# Patient Record
Sex: Female | Born: 1940
Health system: Southern US, Community
[De-identification: ages and names within clinical notes are randomized; demographics above are authoritative.]

## PROBLEM LIST (undated history)

## (undated) DIAGNOSIS — I10 Essential (primary) hypertension: Secondary | ICD-10-CM

## (undated) DIAGNOSIS — R7303 Prediabetes: Secondary | ICD-10-CM

## (undated) DIAGNOSIS — N189 Chronic kidney disease, unspecified: Secondary | ICD-10-CM

## (undated) DIAGNOSIS — H409 Unspecified glaucoma: Secondary | ICD-10-CM

## (undated) DIAGNOSIS — E785 Hyperlipidemia, unspecified: Secondary | ICD-10-CM

## (undated) HISTORY — PX: TUBAL LIGATION: SHX77

## (undated) HISTORY — DX: Prediabetes: R73.03

## (undated) HISTORY — DX: Hyperlipidemia, unspecified: E78.5

## (undated) HISTORY — DX: Unspecified glaucoma: H40.9

## (undated) HISTORY — DX: Essential (primary) hypertension: I10

## (undated) HISTORY — DX: Chronic kidney disease, unspecified: N18.9

---

## 2007-10-30 ENCOUNTER — Emergency Department (HOSPITAL_BASED_OUTPATIENT_CLINIC_OR_DEPARTMENT_OTHER): Admission: EM | Admit: 2007-10-30 | Discharge: 2007-10-31 | Payer: Self-pay | Admitting: Emergency Medicine

## 2007-11-07 ENCOUNTER — Ambulatory Visit: Payer: Self-pay | Admitting: Family Medicine

## 2007-11-07 DIAGNOSIS — H9190 Unspecified hearing loss, unspecified ear: Secondary | ICD-10-CM | POA: Insufficient documentation

## 2007-11-07 DIAGNOSIS — I10 Essential (primary) hypertension: Secondary | ICD-10-CM | POA: Insufficient documentation

## 2007-11-14 ENCOUNTER — Telehealth: Payer: Self-pay | Admitting: Family Medicine

## 2007-11-16 ENCOUNTER — Encounter: Payer: Self-pay | Admitting: Family Medicine

## 2007-11-16 LAB — CONVERTED CEMR LAB
ALT: 12 units/L (ref 0–35)
AST: 17 units/L (ref 0–37)
Albumin: 3.6 g/dL (ref 3.5–5.2)
Alkaline Phosphatase: 86 units/L (ref 39–117)
Calcium: 9 mg/dL (ref 8.4–10.5)
Chloride: 106 meq/L (ref 96–112)
Potassium: 4.8 meq/L (ref 3.5–5.3)
Total CHOL/HDL Ratio: 8.3

## 2007-11-17 ENCOUNTER — Telehealth (INDEPENDENT_AMBULATORY_CARE_PROVIDER_SITE_OTHER): Payer: Self-pay | Admitting: *Deleted

## 2007-11-17 ENCOUNTER — Encounter: Payer: Self-pay | Admitting: Family Medicine

## 2007-11-18 LAB — CONVERTED CEMR LAB: Direct LDL: 127 mg/dL — ABNORMAL HIGH

## 2008-09-22 ENCOUNTER — Encounter: Payer: Self-pay | Admitting: Family Medicine

## 2009-03-19 ENCOUNTER — Other Ambulatory Visit: Admission: RE | Admit: 2009-03-19 | Discharge: 2009-03-19 | Payer: Self-pay | Admitting: Family Medicine

## 2009-03-19 ENCOUNTER — Ambulatory Visit: Payer: Self-pay | Admitting: Family Medicine

## 2009-03-19 ENCOUNTER — Encounter: Payer: Self-pay | Admitting: Family Medicine

## 2009-03-19 DIAGNOSIS — K219 Gastro-esophageal reflux disease without esophagitis: Secondary | ICD-10-CM | POA: Insufficient documentation

## 2009-03-19 DIAGNOSIS — R300 Dysuria: Secondary | ICD-10-CM | POA: Insufficient documentation

## 2009-03-19 LAB — CONVERTED CEMR LAB
Bilirubin Urine: NEGATIVE
Glucose, Urine, Semiquant: NEGATIVE
Nitrite: NEGATIVE
Urobilinogen, UA: 0.2
WBC Urine, dipstick: NEGATIVE
pH: 5.5

## 2009-03-20 ENCOUNTER — Encounter: Payer: Self-pay | Admitting: Family Medicine

## 2009-03-20 LAB — CONVERTED CEMR LAB: RBC / HPF: NONE SEEN

## 2009-03-21 ENCOUNTER — Telehealth: Payer: Self-pay | Admitting: Family Medicine

## 2009-03-21 DIAGNOSIS — R87619 Unspecified abnormal cytological findings in specimens from cervix uteri: Secondary | ICD-10-CM | POA: Insufficient documentation

## 2009-04-03 ENCOUNTER — Ambulatory Visit: Payer: Self-pay | Admitting: Obstetrics & Gynecology

## 2009-08-09 ENCOUNTER — Ambulatory Visit: Payer: Self-pay | Admitting: Family Medicine

## 2009-08-09 DIAGNOSIS — R413 Other amnesia: Secondary | ICD-10-CM

## 2009-08-09 DIAGNOSIS — F0280 Dementia in other diseases classified elsewhere without behavioral disturbance: Secondary | ICD-10-CM | POA: Insufficient documentation

## 2009-08-13 ENCOUNTER — Telehealth: Payer: Self-pay | Admitting: Family Medicine

## 2009-08-21 ENCOUNTER — Encounter: Payer: Self-pay | Admitting: Family Medicine

## 2009-08-21 LAB — CONVERTED CEMR LAB
ALT: 12 units/L (ref 0–35)
Alkaline Phosphatase: 84 units/L (ref 39–117)
Chloride: 100 meq/L (ref 96–112)
Glucose, Bld: 119 mg/dL — ABNORMAL HIGH (ref 70–99)
LDL Cholesterol: 151 mg/dL — ABNORMAL HIGH (ref 0–99)
Potassium: 4.4 meq/L (ref 3.5–5.3)
TSH: 2.044 microintl units/mL (ref 0.350–4.500)
Total Bilirubin: 0.7 mg/dL (ref 0.3–1.2)
Total Protein: 7.3 g/dL (ref 6.0–8.3)
VLDL: 47 mg/dL — ABNORMAL HIGH (ref 0–40)

## 2009-08-26 ENCOUNTER — Encounter: Payer: Self-pay | Admitting: Family Medicine

## 2010-04-17 ENCOUNTER — Encounter (INDEPENDENT_AMBULATORY_CARE_PROVIDER_SITE_OTHER): Payer: Self-pay | Admitting: *Deleted

## 2010-06-10 NOTE — Miscellaneous (Signed)
Summary: Immunization Entry   Immunization History:  Influenza Immunization History:    Influenza:  historical (04/17/2010)

## 2010-06-10 NOTE — Assessment & Plan Note (Signed)
Summary: FU HTN, memory   Vital Signs:  Patient profile:   70 year old female Height:      64.5 inches Weight:      133 pounds Pulse rate:   89 / minute BP sitting:   170 / 88  (left arm) Cuff size:   regular  Vitals Entered By: Geanie Kenning (August 09, 2009 11:09 AM) CC: follow-up Bp, Hypertension Management Comments Re check Bp @ 11:32am- BP-165/88  P=76   Primary Care Provider:  Beatrice Lecher MD  CC:  follow-up Bp and Hypertension Management.  History of Present Illness: Had a mildHA last week.  Doesn't check BP at home. Haven't had labs in over a year. Says has taken her pill every day. No SE and doing well on it.  Says was on HCTZ years ago but doesn't remember having any SE from it.     Also would like her memory tested. Per her daughter who was here wiht her last time the family is concnered about her memory. Has been more forgetful. Pt says she does a lot of crossword puzzles to keep her brain actie.    Hypertension History:      She complains of headache, but denies chest pain, palpitations, dyspnea with exertion, orthopnea, PND, peripheral edema, visual symptoms, neurologic problems, syncope, and side effects from treatment.        Positive major cardiovascular risk factors include female age 86 years old or older, hypertension, and current tobacco user.  Negative major cardiovascular risk factors include negative family history for ischemic heart disease.     Current Medications (verified): 1)  Lisinopril 40 Mg Tabs (Lisinopril) .... Take 1 Tablet By Mouth Once A Day  Comments:  Nurse/Medical Assistant: The patient's medications and allergies were reviewed with the patient and were updated in the Medication and Allergy Lists. Geanie Kenning (August 09, 2009 11:09 AM)  Past History:  Past Medical History: HTN MMSE 27/30.    Social History: Reviewed history from 03/19/2009 and no changes required. Current Smoker. Has a boyfriend.  Alcohol use-no Drug  use-no Regular exercise-yes  Physical Exam  General:  Well-developed,well-nourished,in no acute distress; alert,appropriate and cooperative throughout examination Head:  Normocephalic and atraumatic without obvious abnormalities. No apparent alopecia or balding. Eyes:  No corneal or conjunctival inflammation noted. EOMI. Perrla.  Lungs:  Normal respiratory effort, chest expands symmetrically. Lungs are clear to auscultation, no crackles or wheezes. Heart:  Normal rate and regular rhythm. S1 and S2 normal without gallop, murmur, click, rub or other extra sounds. Skin:  no rashes.   Psych:  Cognition and judgment appear intact. Alert and cooperative with normal attention span and concentration. No apparent delusions, illusions, hallucinations   Impression & Recommendations:  Problem # 1:  HYPERTENSION, BENIGN (ICD-401.1) Assessment Comment Only Still elevated. Says has been very consistant with her pill. Will change to losartan with hctz.  F/u in  month to recheck. Call sooner if any SE.  Due for CMP. Will recheck in one week once starts the new BP medication. Also due for screening lipids.  Her updated medication list for this problem includes:    Losartan Potassium-hctz 100-12.5 Mg Tabs (Losartan potassium-hctz) .Marland Kitchen... Take 1 tablet by mouth once a day  Orders: T-Comprehensive Metabolic Panel ([id number]) T-Lipid Profile [id number]) T-TSH [id number])  Problem # 2:  MEMORY LOSS (ICD-780.93) Assessment: New MMSE exam today scored 27/30 (passing for her age and education is 57). Discussed that this stilll may be normal but may have  some premature memory loss. Discused repeating testin 6-12 months and getting BP under good control as not to cause any ischemic changes to her brain.  Also work on work puzzles and reading to keep her brain active and may want to use a calendar and reminder system to he;lp her stay organized.  Will also check her thyroid levels.    Complete Medication  List: 1)  Losartan Potassium-hctz 100-12.5 Mg Tabs (Losartan potassium-hctz) .... Take 1 tablet by mouth once a day  Hypertension Assessment/Plan:      The patient's hypertensive risk group is category B: At least one risk factor (excluding diabetes) with no target organ damage.  Today's blood pressure is 170/88.    Patient Instructions: 1)  Go to the lab in about one week.  2)  Follow up in one month with med for blood pressure check.  Prescriptions: LOSARTAN POTASSIUM-HCTZ 100-12.5 MG TABS (LOSARTAN POTASSIUM-HCTZ) Take 1 tablet by mouth once a day  #30 x 1   Entered and Authorized by:   Beatrice Lecher MD   Signed by:   Beatrice Lecher MD on 08/09/2009   Method used:   Electronically to        [facility] [phone]* (retail)       [address] [place], [place]  [address]       Ph: [phone]       Fax: [phone]   [place]:   [phone]

## 2010-06-10 NOTE — Progress Notes (Signed)
Summary: Med for H/A  Phone Note Call from Patient Call back at [phone]   Caller: Patient Call For: Beatrice Lecher MD Summary of Call: Pt calls and wants to know what she could take for a headache Initial call taken by: Geanie Kenning,  August 13, 2009 4:32 PM  Follow-up for Phone Call        Tylenol is safe since has High blood pressure.  Follow-up by: Beatrice Lecher MD,  August 13, 2009 4:52 PM  Additional Follow-up for Phone Call Additional follow up Details #1::        pt notified of MD instructions. Additional Follow-up by: Geanie Kenning,  August 13, 2009 4:55 PM

## 2010-06-10 NOTE — Letter (Signed)
Summary: MMSE Form/[facility]  MMSE Form/[facility]   Imported By: Edmonia James 08/19/2009 10:16:45  _____________________________________________________________________  External Attachment:    Type:   Image     Comment:   External Document

## 2010-06-10 NOTE — Letter (Signed)
Summary: Generic Letter  [facility]  [address] [place]   [place], [place] [address]   Phone: [phone]  Fax: [phone]    08/26/2009  Cassidy Jennings [address] [place], [place]  [address]  Dear [name],  We have received your lab work back and have been unable to reach you. Your blood sugar is elevated so lets recheck it when you come in for your appointment in May. In meantime avoid concentrated sugars and decrease your carbohydrate intake. Cholesterol is high and really need to start a cholesterol pill at bedtime in addition to exercise and watching a low fat diet.  You will also need a pill to help get your cholesterol completely down. We have sent a prescription for this med to your pharmacy.  Lets recheck your levels in 6-8 weeks.  Your thyroid level was normal.    Attached is a copy of your labwork.         Sincerely,   Beatrice Lecher, MD

## 2010-10-29 ENCOUNTER — Other Ambulatory Visit: Payer: Self-pay | Admitting: Family Medicine

## 2010-10-30 ENCOUNTER — Telehealth: Payer: Self-pay | Admitting: Family Medicine

## 2010-10-30 NOTE — Telephone Encounter (Signed)
Needs f/u appt 

## 2010-10-30 NOTE — Telephone Encounter (Signed)
Call pt: She needs f/u appt has been over a year.

## 2010-10-31 NOTE — Telephone Encounter (Addendum)
Tried to reach the pt.  [employer] [employer] out of service area and couldn't take a message.  Will wait for the pt to call the office back. Morene Rankins, LPN [facility]

## 2011-02-05 LAB — COMPREHENSIVE METABOLIC PANEL
ALT: 15
AST: 22
CO2: 33 — ABNORMAL HIGH
Calcium: 9.5
Chloride: 94 — ABNORMAL LOW
GFR calc Af Amer: 46 — ABNORMAL LOW
GFR calc non Af Amer: 38 — ABNORMAL LOW
Sodium: 137
Total Bilirubin: 0.6

## 2011-02-05 LAB — CBC
RBC: 4.43
WBC: 6.9

## 2011-02-05 LAB — POCT CARDIAC MARKERS: CKMB, poc: 2.2

## 2011-02-05 LAB — DIFFERENTIAL
Eosinophils Absolute: 0
Eosinophils Relative: 1
Lymphs Abs: 1.9
Monocytes Absolute: 0.4

## 2011-02-05 LAB — PROTIME-INR: Prothrombin Time: 12.9

## 2011-04-01 ENCOUNTER — Other Ambulatory Visit: Payer: Self-pay | Admitting: *Deleted

## 2011-04-01 ENCOUNTER — Other Ambulatory Visit: Payer: Self-pay | Admitting: Family Medicine

## 2011-04-01 MED ORDER — SIMVASTATIN 40 MG PO TABS: 40.0000 mg | ORAL_TABLET | Freq: Every day | ORAL | Status: DC

## 2011-04-06 ENCOUNTER — Other Ambulatory Visit: Payer: Self-pay | Admitting: *Deleted

## 2011-04-06 MED ORDER — LOSARTAN POTASSIUM-HCTZ 50-12.5 MG PO TABS: 1.0000 | ORAL_TABLET | Freq: Every day | ORAL | Status: DC

## 2011-04-14 ENCOUNTER — Telehealth: Payer: Self-pay | Admitting: *Deleted

## 2011-04-14 NOTE — Telephone Encounter (Signed)
What are her allergies? I can call in a new ABX for her.

## 2011-04-14 NOTE — Telephone Encounter (Signed)
Pt seen in ED last Tuesday and diagnosed with bronchitis and given Prednisone, albuterol inhaler and antiobiotic Clarithomycin 500mg  2 tabs by mouth. Has finished all meds and still coughing with some tellow phlegm, afebrile, sweats. Doesn't feel any better at all. PLease advise

## 2011-04-15 MED ORDER — AZITHROMYCIN 250 MG PO TABS: ORAL_TABLET | ORAL | Status: AC

## 2011-04-15 NOTE — Telephone Encounter (Signed)
Pt has no allergies.

## 2011-04-15 NOTE — Telephone Encounter (Signed)
Rx sent 

## 2011-04-16 ENCOUNTER — Ambulatory Visit (INDEPENDENT_AMBULATORY_CARE_PROVIDER_SITE_OTHER): Payer: Medicare Other | Admitting: Family Medicine

## 2011-04-16 VITALS — BP 145/82 | HR 85 | Ht 63.0 in | Wt 138.0 lb

## 2011-04-16 DIAGNOSIS — Z2839 Other underimmunization status: Secondary | ICD-10-CM

## 2011-04-16 DIAGNOSIS — H6120 Impacted cerumen, unspecified ear: Secondary | ICD-10-CM | POA: Insufficient documentation

## 2011-04-16 DIAGNOSIS — Z283 Underimmunization status: Secondary | ICD-10-CM

## 2011-04-16 MED ORDER — CARBAMIDE PEROXIDE 6.5 % OT SOLN: 5.0000 [drp] | Freq: Two times a day (BID) | OTIC | Status: AC

## 2011-04-16 NOTE — Assessment & Plan Note (Addendum)
Subjective:    Cassidy Jennings is a 70 y.o. female whom I am asked to see for evaluation of diminished hearing in the left ear for the past ongoing months. There is a prior history of cerumen impaction. The patient has not been using ear drops to loosen wax immediately prior to this visit. The patient denies ear pain.  The patient's history has been marked as reviewed and updated as appropriate.  Review of Systems Pertinent items are noted in HPI.    Objective:    Auditory canal(s) of the left ear are completely obstructed with cerumen.   Cerumen removal after using gentle irrigation and soft plastic curettes after irrigation. Was unsuccesssful in removing sany significant wasx Assessment:    Cerumen Impaction without otitis externa.   unsuccessfull in resolving ear impaction since a significant amount is still present Plan:    1. Care instructions given. 2. Home treatment: debrox ear wx softner. 3. Follow-up in 5 days for repeat attempt

## 2011-04-16 NOTE — Patient Instructions (Signed)
Cerumen Impaction A cerumen impaction is when the wax in your ear forms a plug. This plug usually causes reduced hearing. Sometimes it also causes an earache or dizziness. Removing a cerumen impaction can be difficult and painful. The wax sticks to the ear canal. The canal is sensitive and bleeds easily. If you try to remove a heavy wax buildup with a cotton tipped swab, you may push it in further. Irrigation with water, suction, and small ear curettes may be used to clear out the wax. If the impaction is fixed to the skin in the ear canal, ear drops may be needed for a few days to loosen the wax. People who build up a lot of wax frequently can use ear wax removal products available in your local drugstore. SEEK MEDICAL CARE IF:  You develop an earache, increased hearing loss, or marked dizziness. Document Released: 06/04/2004 Document Revised: 01/07/2011 Document Reviewed: 07/25/2009 [facility] Patient Information 2012 [place].

## 2011-04-19 ENCOUNTER — Encounter: Payer: Self-pay | Admitting: Family Medicine

## 2011-04-19 NOTE — Progress Notes (Signed)
  Subjective:    Patient ID: Cassidy Jennings, female    DOB: 18-Dec-1940, 70 y.o.   MRN: [id number]  HPI #1 immunization update Excessive wax in L ear. She reports pain in the L ear due to wax build up.  Review of Systems  Respiratory:       Hx of recent bronchitis a few weeks ago      BP 145/82  Pulse 85  Ht 5\' 3"  (1.6 m)  Wt 138 lb (62.596 kg)  BMI 24.45 kg/m2  SpO2 96% Objective:   Physical Exam  Constitutional: She appears well-developed.  HENT:  Head: Normocephalic.  Right Ear: Decreased hearing is noted.  Left Ear: No decreased hearing is noted.       Wax present in both ears but L>R          Assessment & Plan:  #1 offered to try and update about multiple immunization and health needs patient literaly acted as if sher did not hear me talking about them

## 2011-04-21 ENCOUNTER — Ambulatory Visit (INDEPENDENT_AMBULATORY_CARE_PROVIDER_SITE_OTHER): Payer: Medicare Other | Admitting: Family Medicine

## 2011-04-21 VITALS — BP 128/68 | HR 100 | Ht 64.0 in | Wt 138.0 lb

## 2011-04-21 DIAGNOSIS — Z23 Encounter for immunization: Secondary | ICD-10-CM

## 2011-04-21 DIAGNOSIS — H6120 Impacted cerumen, unspecified ear: Secondary | ICD-10-CM

## 2011-04-21 MED ORDER — TETANUS-DIPHTH-ACELL PERTUSSIS 5-2.5-18.5 LF-MCG/0.5 IM SUSP: 0.5000 mL | Freq: Once | INTRAMUSCULAR | Status: DC

## 2011-04-21 NOTE — Patient Instructions (Signed)

## 2011-04-21 NOTE — Progress Notes (Signed)
  Subjective:    Patient ID: Cassidy Jennings, female    DOB: 11-22-1940, 70 y.o.   MRN: [id number]  HPI Patient's here for recheck of her ear . Excessive wax was present in both years with the left being markedly impacted.  #2 over 2 health maintenance Review of Systems  All other systems reviewed and are negative.      BP 128/68  Pulse 100  Ht 5\' 4"  (1.626 m)  Wt 138 lb (62.596 kg)  BMI 23.69 kg/m2  SpO2 97% Objective:   Physical Exam  After irrigation both ears are markedly improved which is a little bit of redness of the left eardrum present.      Assessment & Plan:  Assessment plan excessive wax in the ears resolved. Recommend using the Debrox on a regular basis at least 2-3 times a week and increasing the amount when wax buildup occurs.  #2 discussed patient she's agreed to get her tetanus and her pneumonia shot today we'll have her come back for complete physical in 3-4 months at that time we did discuss with her about colonoscopy and also shingles vaccination.

## 2011-06-23 ENCOUNTER — Encounter: Payer: Self-pay | Admitting: Physician Assistant

## 2011-06-23 ENCOUNTER — Ambulatory Visit (INDEPENDENT_AMBULATORY_CARE_PROVIDER_SITE_OTHER): Payer: Medicare Other | Admitting: Physician Assistant

## 2011-06-23 VITALS — BP 141/77 | HR 101 | Temp 98.1°F | Wt 138.0 lb

## 2011-06-23 DIAGNOSIS — L299 Pruritus, unspecified: Secondary | ICD-10-CM

## 2011-06-23 DIAGNOSIS — T50995A Adverse effect of other drugs, medicaments and biological substances, initial encounter: Secondary | ICD-10-CM

## 2011-06-23 DIAGNOSIS — Z888 Allergy status to other drugs, medicaments and biological substances status: Secondary | ICD-10-CM

## 2011-06-23 MED ORDER — HYDROXYZINE HCL 25 MG PO TABS: 25.0000 mg | ORAL_TABLET | Freq: Three times a day (TID) | ORAL | Status: AC | PRN

## 2011-06-23 MED ORDER — METHYLPREDNISOLONE ACETATE 80 MG/ML IJ SUSP: 80.0000 mg | Freq: Once | INTRAMUSCULAR | Status: DC

## 2011-06-23 NOTE — Progress Notes (Signed)
  Subjective:    Patient ID: Cassidy Jennings, female    DOB: [birth year], 71 y.o.   MRN: [id number]  HPI Patient presents to the clinic with itching for 3 days. She went to get her hair dyed 3 days ago and she had an allergic reaction. Her face got red and swollen and she started itching all over her body. Patient went to the emergency room on Sunday night(2 days ago). They gave her IV Benadryl and prednisone. She was also given oral Benadryl and prednisone to take home and take. Patient was scared to take prednisone and did not take any orally. She has taken the Benadryl on a daily basis and it does help her sleep some and help the itching minimally. She still continues to itch all over her body. She denies any shortness of breath, wheezing, or problems swallowing. Has appointment with allergist tomorrow.    Review of Systems     Objective:   Physical Exam  Constitutional: She is oriented to person, place, and time. She appears well-developed and well-nourished.       Upon entering room patient was distressed scratching all over her body from her head down to her toes. She did this the entire interview.   HENT:  Head: Normocephalic and atraumatic.  Cardiovascular:       Tachycardia at 101. Systolic ejection murmur 3/6. Normal rhythm.  Pulmonary/Chest: Effort normal and breath sounds normal. She has no wheezes.  Neurological: She is alert and oriented to person, place, and time.  Skin:       Small purpuric papules all over arms, legs, trunk, and head. Excoriations present all over body. No edema.          Assessment & Plan:  Itching/allergic reaction to dye- Vistaril 25 mg up to TID for itching. Depo-Medrol 80 mg IM given in office. Can continue to take Benadryl at night to help her sleep. Keep appointment with allergist for tomorrow. Follow up with any worsening of symptoms. Go to emergency room if you develop problems swallowing or trouble breathing.  Discussed colonoscopy. Patient  is aware of the importance of this screening test and wants to be reminded later this year.

## 2011-06-23 NOTE — Patient Instructions (Signed)
Gave Prednisone shot in the office. Vistaril to take up TID for itching. Continue to take Benadryl at night. Keep appointment with allergist.

## 2011-07-14 ENCOUNTER — Encounter: Payer: Medicare Other | Admitting: Family Medicine

## 2011-07-14 DIAGNOSIS — Z0289 Encounter for other administrative examinations: Secondary | ICD-10-CM

## 2011-07-22 ENCOUNTER — Other Ambulatory Visit: Payer: Self-pay | Admitting: Family Medicine

## 2011-09-09 ENCOUNTER — Other Ambulatory Visit: Payer: Self-pay | Admitting: Family Medicine

## 2011-09-18 ENCOUNTER — Telehealth: Payer: Self-pay | Admitting: *Deleted

## 2011-09-18 NOTE — Telephone Encounter (Signed)
Daughter informed

## 2011-09-18 NOTE — Telephone Encounter (Signed)
Daughter states that pt has cough and runny nose. Would like to know what she can take with her medical history? States mom has glaucoma and HTN.

## 2011-09-18 NOTE — Telephone Encounter (Signed)
Can try OTC claritin (without the D). May be allergy relatd.If not better into next week make an appt.

## 2012-03-21 ENCOUNTER — Other Ambulatory Visit: Payer: Self-pay | Admitting: *Deleted

## 2012-03-21 MED ORDER — LOSARTAN POTASSIUM-HCTZ 100-12.5 MG PO TABS: 1.0000 | ORAL_TABLET | Freq: Every day | ORAL | Status: DC

## 2012-04-11 ENCOUNTER — Other Ambulatory Visit: Payer: Self-pay | Admitting: *Deleted

## 2012-04-11 MED ORDER — SIMVASTATIN 40 MG PO TABS: 40.0000 mg | ORAL_TABLET | Freq: Every day | ORAL | Status: DC

## 2012-05-10 ENCOUNTER — Telehealth: Payer: Self-pay | Admitting: Family Medicine

## 2012-05-10 ENCOUNTER — Encounter: Payer: Self-pay | Admitting: Family Medicine

## 2012-05-10 ENCOUNTER — Ambulatory Visit (INDEPENDENT_AMBULATORY_CARE_PROVIDER_SITE_OTHER): Payer: Medicare Other | Admitting: Family Medicine

## 2012-05-10 ENCOUNTER — Ambulatory Visit (INDEPENDENT_AMBULATORY_CARE_PROVIDER_SITE_OTHER): Payer: Medicare Other

## 2012-05-10 VITALS — BP 158/78 | HR 93 | Resp 18 | Ht 63.25 in | Wt 137.0 lb

## 2012-05-10 DIAGNOSIS — Z0000 Encounter for general adult medical examination without abnormal findings: Secondary | ICD-10-CM | POA: Diagnosis not present

## 2012-05-10 DIAGNOSIS — H269 Unspecified cataract: Secondary | ICD-10-CM | POA: Diagnosis not present

## 2012-05-10 DIAGNOSIS — I10 Essential (primary) hypertension: Secondary | ICD-10-CM

## 2012-05-10 DIAGNOSIS — Z1211 Encounter for screening for malignant neoplasm of colon: Secondary | ICD-10-CM | POA: Diagnosis not present

## 2012-05-10 DIAGNOSIS — Z1231 Encounter for screening mammogram for malignant neoplasm of breast: Secondary | ICD-10-CM

## 2012-05-10 DIAGNOSIS — E785 Hyperlipidemia, unspecified: Secondary | ICD-10-CM

## 2012-05-10 DIAGNOSIS — H409 Unspecified glaucoma: Secondary | ICD-10-CM | POA: Insufficient documentation

## 2012-05-10 MED ORDER — AMBULATORY NON FORMULARY MEDICATION: Status: DC

## 2012-05-10 NOTE — Telephone Encounter (Signed)
Please call patient her know that I change my mind. I would like to see her back in one month to recheck her blood pressure since it was not at goal today. I know she was fasting so that's why want to recheck it before and make changes to her regimen.

## 2012-05-10 NOTE — Progress Notes (Signed)
Subjective:    Cassidy Jennings is a 71 y.o. female who presents for Medicare Annual/Subsequent preventive examination.  Her daughter is here with her today.   Preventive Screening-Counseling & Management  Tobacco History  Smoking status  . Current Every Day Smoker -- 2.0 packs/day for 55 years  . Types: Cigarettes  Smokeless tobacco  . Not on file     Problems Prior to Visit 1.   Current Problems (verified) Patient Active Problem List  Diagnosis  . DECREASED HEARING  . GERD  . MEMORY LOSS  . PAP SMEAR, ABNORMAL  . Hypertension  . Hyperlipidemia  . Cataract  . Glaucoma    Medications Prior to Visit Current Outpatient Prescriptions on File Prior to Visit  Medication Sig Dispense Refill  . losartan-hydrochlorothiazide (HYZAAR) 100-12.5 MG per tablet Take 1 tablet by mouth daily.  30 tablet  2  . simvastatin (ZOCOR) 40 MG tablet Take 1 tablet (40 mg total) by mouth at bedtime.  30 tablet  1    Current Medications (verified) Current Outpatient Prescriptions  Medication Sig Dispense Refill  . losartan-hydrochlorothiazide (HYZAAR) 100-12.5 MG per tablet Take 1 tablet by mouth daily.  30 tablet  2  . simvastatin (ZOCOR) 40 MG tablet Take 1 tablet (40 mg total) by mouth at bedtime.  30 tablet  1  . AMBULATORY NON FORMULARY MEDICATION Medication Name: Zostavax IM x 1  1 vial  0     Allergies (verified) Review of patient's allergies indicates no known allergies.   PAST HISTORY  Family History Family History  Problem Relation Age of Onset  . Hypertension Mother   . Heart attack Mother 59    Social History History  Substance Use Topics  . Smoking status: Current Every Day Smoker -- 2.0 packs/day for 55 years    Types: Cigarettes  . Smokeless tobacco: Not on file  . Alcohol Use: No     Are there smokers in your home (other than you)? No  Risk Factors Current exercise habits: none  Dietary issues discussed: none   Cardiac risk factors: advanced age (older  than 37 for men, 54 for women), hypertension and sedentary lifestyle.  Depression Screen (Note: if answer to either of the following is "Yes", a more complete depression screening is indicated)   Over the past two weeks, have you felt down, depressed or hopeless? No  Over the past two weeks, have you felt little interest or pleasure in doing things? No  Have you lost interest or pleasure in daily life? No  Do you often feel hopeless? No  Do you cry easily over simple problems? No  Activities of Daily Living In your present state of health, do you have any difficulty performing the following activities?:  Driving? No Managing money?  No Feeding yourself? No Getting from bed to chair? No Climbing a flight of stairs? No Preparing food and eating?: No Bathing or showering? No Getting dressed: No Getting to the toilet? No Using the toilet:No Moving around from place to place: No In the past year have you fallen or had a near fall?:No   Are you sexually active?  No  Do you have more than one partner?  No  Hearing Difficulties: No Do you often ask people to speak up or repeat themselves? No Do you experience ringing or noises in your ears? No Do you have difficulty understanding soft or whispered voices? No   Do you feel that you have a problem with memory? No  Do  you often misplace items? No  Do you feel safe at home?  No  Cognitive Testing  Alert? Yes  Normal Appearance?Yes  Oriented to person? Yes  Place? Yes   Time? Yes  Recall of three objects?  Yes  Can perform simple calculations? Yes  Displays appropriate judgment?Yes  Can read the correct time from a watch face?Yes   Advanced Directives have been discussed with the patient? Yes  List the Names of Other Physician/Practitioners you currently use: 1.    Indicate any recent Medical Services you may have received from other than [facility] providers in the past year (date may be approximate).  Immunization History    Administered Date(s) Administered  . Influenza Split 03/22/2011, 03/31/2012  . Influenza Whole 03/19/2009, 04/17/2010  . Pneumococcal Polysaccharide 04/21/2011  . Tdap 04/21/2011    Screening Tests Health Maintenance  Topic Date Due  . Colonoscopy  05/29/1991  . Zostavax  05/28/2001  . Influenza Vaccine  01/09/2013  . Tetanus/tdap  04/20/2021  . Pneumococcal Polysaccharide Vaccine Age 4 And Over  Completed    All answers were reviewed with the patient and necessary referrals were made:  Marquinn Meschke, MD   05/10/2012   History reviewed: allergies, current medications, past family history, past medical history, past social history, past surgical history and problem list  Review of Systems A comprehensive review of systems was negative.    Objective:     Vision by Snellen chart: right eye:20/70, left eye:20/50  Body mass index is 24.08 kg/(m^2). BP 158/78  Pulse 93  Resp 18  Ht 5' 3.25" (1.607 m)  Wt 137 lb (62.143 kg)  BMI 24.08 kg/m2  SpO2 95%  BP 158/78  Pulse 93  Resp 18  Ht 5' 3.25" (1.607 m)  Wt 137 lb (62.143 kg)  BMI 24.08 kg/m2  SpO2 95%  General Appearance:    Alert, cooperative, no distress, appears stated age  Head:    Normocephalic, without obvious abnormality, atraumatic  Eyes:    PERRL, conjunctiva/corneas clear, EOM's intact, both eyes  Ears:    Normal TM's and external ear canals, both ears  Nose:   Nares normal, septum midline, mucosa normal, no drainage    or sinus tenderness  Throat:   Lips, mucosa, and tongue normal; teeth and gums normal  Neck:   Supple, symmetrical, trachea midline, no adenopathy;    thyroid:  no enlargement/tenderness/nodules; no carotid   bruit or JVD  Back:     Symmetric, no curvature, ROM normal, no CVA tenderness  Lungs:     Clear to auscultation bilaterally, respirations unlabored  Chest Wall:    No tenderness or deformity   Heart:    Regular rate and rhythm, S1 and S2 normal, no murmur, rub   or gallop   Breast Exam:    Not performed.   Abdomen:     Soft, non-tender, bowel sounds active all four quadrants,    no masses, no organomegaly  Genitalia:    Not performed.   Rectal:    Not performed.   Extremities:   Extremities normal, atraumatic, no cyanosis or edema  Pulses:   2+ and symmetric all extremities  Skin:   Skin color, texture, turgor normal, no rashes or lesions  Lymph nodes:   Cervical, supraclavicular, and axillary nodes normal  Neurologic:   CNII-XII intact, normal strength, sensation and reflexes    throughout       Assessment:     Medicare Annual Wellness Exam     Plan:  During the course of the visit the patient was educated and counseled about appropriate screening and preventive services including:    Screening mammography  Colorectal cancer screening  shingles vaccine  Tobacco abuse-encourage cessation. She says she's more of a social smoker and family members come in for that smoke she smokes with him. She knows she needs to quit but is not quite ready. I will mail her some additional information for smoking cessation.  Hypertension-uncontrolled today. She is fasting.  Glaucoma-her monitor get back in with her doctor. She says she drops along time ago. I explained that it's important to stay on these because it helps prevent blindness from her glaucoma. She says she will call to make an appointment.  Diet review for nutrition referral? Yes ____  Not Indicated __x__   Patient Instructions (the written plan) was given to the patient.  Medicare Attestation I have personally reviewed: The patient's medical and social history Their use of alcohol, tobacco or illicit drugs Their current medications and supplements The patient's functional ability including ADLs,fall risks, home safety risks, cognitive, and hearing and visual impairment Diet and physical activities Evidence for depression or mood disorders  The patient's weight, height, BMI, and  visual acuity have been recorded in the chart.  I have made referrals, counseling, and provided education to the patient based on review of the above and I have provided the patient with a written personalized care plan for preventive services.     Lawrence Mitch, MD   05/10/2012

## 2012-05-10 NOTE — Patient Instructions (Addendum)
Please get him to schedule your eye appointment. You need to get back on her eyedrops for glaucoma. Try to get the lab and you can. Please fast for 8 hours. You can have water. Keep up a regular exercise program and make sure you are eating a healthy diet Try to eat 4 servings of dairy a day, or if you are lactose intolerant take a calcium with vitamin D daily.  Your vaccines are up to date.  Try to get your shingles vaccine at your pharmacy. Smoking Cessation, Tips for Success YOU CAN QUIT SMOKING If you are ready to quit smoking, congratulations! You have chosen to help yourself be healthier. Cigarettes bring nicotine, tar, carbon monoxide, and other irritants into your body. Your lungs, heart, and blood vessels will be able to work better without these poisons. There are many different ways to quit smoking. Nicotine gum, nicotine patches, a nicotine inhaler, or nicotine nasal spray can help with physical craving. Hypnosis, support groups, and medicines help break the habit of smoking. Here are some tips to help you quit for good.  Throw away all cigarettes.   Clean and remove all ashtrays from your home, work, and car.   On a card, write down your reasons for quitting. Carry the card with you and read it when you get the urge to smoke.   Cleanse your body of nicotine. Drink enough water and fluids to keep your urine clear or pale yellow. Do this after quitting to flush the nicotine from your body.   Learn to predict your moods. Do not let a bad situation be your excuse to have a cigarette. Some situations in your life might tempt you into wanting a cigarette.   Never have "just one" cigarette. It leads to wanting another and another. Remind yourself of your decision to quit.   Change habits associated with smoking. If you smoked while driving or when feeling stressed, try other activities to replace smoking. Stand up when drinking your coffee. Brush your teeth after eating. Sit in a different  chair when you read the paper. Avoid alcohol while trying to quit, and try to drink fewer caffeinated beverages. Alcohol and caffeine may urge you to smoke.   Avoid foods and drinks that can trigger a desire to smoke, such as sugary or spicy foods and alcohol.   Ask people who smoke not to smoke around you.   Have something planned to do right after eating or having a cup of coffee. Take a walk or exercise to perk you up. This will help to keep you from overeating.   Try a relaxation exercise to calm you down and decrease your stress. Remember, you may be tense and nervous for the first 2 weeks after you quit, but this will pass.   Find new activities to keep your hands busy. Play with a pen, coin, or rubber band. Doodle or draw things on paper.   Brush your teeth right after eating. This will help cut down on the craving for the taste of tobacco after meals. You can try mouthwash, too.   Use oral substitutes, such as lemon drops, carrots, a cinnamon stick, or chewing gum, in place of cigarettes. Keep them handy so they are available when you have the urge to smoke.   When you have the urge to smoke, try deep breathing.   Designate your home as a nonsmoking area.   If you are a heavy smoker, ask your caregiver about a prescription for nicotine  chewing gum. It can ease your withdrawal from nicotine.   Reward yourself. Set aside the cigarette money you save and buy yourself something nice.   Look for support from others. Join a support group or smoking cessation program. Ask someone at home or at work to help you with your plan to quit smoking.   Always ask yourself, "Do I need this cigarette or is this just a reflex?" Tell yourself, "Today, I choose not to smoke," or "I do not want to smoke." You are reminding yourself of your decision to quit, even if you do smoke a cigarette.  HOW WILL I FEEL WHEN I QUIT SMOKING?  The benefits of not smoking start within days of quitting.   You may  have symptoms of withdrawal because your body is used to nicotine (the addictive substance in cigarettes). You may crave cigarettes, be irritable, feel very hungry, cough often, get headaches, or have difficulty concentrating.   The withdrawal symptoms are only temporary. They are strongest when you first quit but will go away within 10 to 14 days.   When withdrawal symptoms occur, stay in control. Think about your reasons for quitting. Remind yourself that these are signs that your body is healing and getting used to being without cigarettes.   Remember that withdrawal symptoms are easier to treat than the major diseases that smoking can cause.   Even after the withdrawal is over, expect periodic urges to smoke. However, these cravings are generally short-lived and will go away whether you smoke or not. Do not smoke!   If you relapse and smoke again, do not lose hope. Most smokers quit 3 times before they are successful.   If you relapse, do not give up! Plan ahead and think about what you will do the next time you get the urge to smoke.  LIFE AS A NONSMOKER: MAKE IT FOR A MONTH, MAKE IT FOR LIFE Day 1: Hang this page where you will see it every day. Day 2: Get rid of all ashtrays, matches, and lighters. Day 3: Drink water. Breathe deeply between sips. Day 4: Avoid places with smoke-filled air, such as bars, clubs, or the smoking section of restaurants. Day 5: Keep track of how much money you save by not smoking. Day 6: Avoid boredom. Keep a good book with you or go to the movies. Day 7: Reward yourself! One week without smoking! Day 8: Make a dental appointment to get your teeth cleaned. Day 9: Decide how you will turn down a cigarette before it is offered to you. Day 10: Review your reasons for quitting. Day 11: Distract yourself. Stay active to keep your mind off smoking and to relieve tension. Take a walk, exercise, read a book, do a crossword puzzle, or try a new hobby. Day 12:  Exercise. Get off the bus before your stop or use stairs instead of escalators. Day 13: Call on friends for support and encouragement. Day 14: Reward yourself! Two weeks without smoking! Day 15: Practice deep breathing exercises. Day 16: Bet a friend that you can stay a nonsmoker. Day 17: Ask to sit in nonsmoking sections of restaurants. Day 18: Hang up "No Smoking" signs. Day 19: Think of yourself as a nonsmoker. Day 20: Each morning, tell yourself you will not smoke. Day 21: Reward yourself! Three weeks without smoking! Day 22: Think of smoking in negative ways. Remember how it stains your teeth, gives you bad breath, and leaves you short of breath. Day 23: Eat a  nutritious breakfast. Day 24:Do not relive your days as a smoker. Day 25: Hold a pencil in your hand when talking on the telephone. Day 26: Tell all your friends you do not smoke. Day 27: Think about how much better food tastes. Day 28: Remember, one cigarette is one too many. Day 29: Take up a hobby that will keep your hands busy. Day 30: Congratulations! One month without smoking! Give yourself a big reward. Your caregiver can direct you to community resources or hospitals for support, which may include:  Group support.   Education.   Hypnosis.   Subliminal therapy.  Document Released: 01/24/2004 Document Revised: 07/20/2011 Document Reviewed: 02/11/2009 [facility] Patient Information 2013 [place].

## 2012-05-10 NOTE — Telephone Encounter (Signed)
Patient advised and scheduled.  

## 2012-06-03 LAB — COMPLETE METABOLIC PANEL WITH GFR
ALT: 15 U/L (ref 0–35)
AST: 18 U/L (ref 0–37)
Albumin: 4 g/dL (ref 3.5–5.2)
BUN: 11 mg/dL (ref 6–23)
Calcium: 9.8 mg/dL (ref 8.4–10.5)
Chloride: 102 mEq/L (ref 96–112)
Potassium: 4.3 mEq/L (ref 3.5–5.3)
Sodium: 141 mEq/L (ref 135–145)
Total Protein: 7.3 g/dL (ref 6.0–8.3)

## 2012-06-03 LAB — TSH: TSH: 2.632 u[IU]/mL (ref 0.350–4.500)

## 2012-06-03 LAB — LIPID PANEL
Cholesterol: 161 mg/dL (ref 0–200)
LDL Cholesterol: 81 mg/dL (ref 0–99)
VLDL: 50 mg/dL — ABNORMAL HIGH (ref 0–40)

## 2012-06-10 ENCOUNTER — Other Ambulatory Visit: Payer: Self-pay | Admitting: *Deleted

## 2012-06-14 ENCOUNTER — Ambulatory Visit: Payer: Medicare Other | Admitting: Family Medicine

## 2012-06-15 ENCOUNTER — Encounter: Payer: Self-pay | Admitting: Family Medicine

## 2012-06-15 ENCOUNTER — Ambulatory Visit (INDEPENDENT_AMBULATORY_CARE_PROVIDER_SITE_OTHER): Payer: Medicare Other | Admitting: Family Medicine

## 2012-06-15 VITALS — BP 126/67 | HR 80 | Resp 16 | Wt 139.0 lb

## 2012-06-15 DIAGNOSIS — Z720 Tobacco use: Secondary | ICD-10-CM

## 2012-06-15 DIAGNOSIS — R739 Hyperglycemia, unspecified: Secondary | ICD-10-CM

## 2012-06-15 DIAGNOSIS — I10 Essential (primary) hypertension: Secondary | ICD-10-CM

## 2012-06-15 DIAGNOSIS — R7309 Other abnormal glucose: Secondary | ICD-10-CM | POA: Diagnosis not present

## 2012-06-15 DIAGNOSIS — F17200 Nicotine dependence, unspecified, uncomplicated: Secondary | ICD-10-CM

## 2012-06-15 DIAGNOSIS — E119 Type 2 diabetes mellitus without complications: Secondary | ICD-10-CM

## 2012-06-15 LAB — POCT GLYCOSYLATED HEMOGLOBIN (HGB A1C): Hemoglobin A1C: 6.5

## 2012-06-15 NOTE — Patient Instructions (Addendum)
Smoking Cessation, Tips for Success YOU CAN QUIT SMOKING If you are ready to quit smoking, congratulations! You have chosen to help yourself be healthier. Cigarettes bring nicotine, tar, carbon monoxide, and other irritants into your body. Your lungs, heart, and blood vessels will be able to work better without these poisons. There are many different ways to quit smoking. Nicotine gum, nicotine patches, a nicotine inhaler, or nicotine nasal spray can help with physical craving. Hypnosis, support groups, and medicines help break the habit of smoking. Here are some tips to help you quit for good.  Throw away all cigarettes.   Clean and remove all ashtrays from your home, work, and car.   On a card, write down your reasons for quitting. Carry the card with you and read it when you get the urge to smoke.   Cleanse your body of nicotine. Drink enough water and fluids to keep your urine clear or pale yellow. Do this after quitting to flush the nicotine from your body.   Learn to predict your moods. Do not let a bad situation be your excuse to have a cigarette. Some situations in your life might tempt you into wanting a cigarette.   Never have "just one" cigarette. It leads to wanting another and another. Remind yourself of your decision to quit.   Change habits associated with smoking. If you smoked while driving or when feeling stressed, try other activities to replace smoking. Stand up when drinking your coffee. Brush your teeth after eating. Sit in a different chair when you read the paper. Avoid alcohol while trying to quit, and try to drink fewer caffeinated beverages. Alcohol and caffeine may urge you to smoke.   Avoid foods and drinks that can trigger a desire to smoke, such as sugary or spicy foods and alcohol.   Ask people who smoke not to smoke around you.   Have something planned to do right after eating or having a cup of coffee. Take a walk or exercise to perk you up. This will help to  keep you from overeating.   Try a relaxation exercise to calm you down and decrease your stress. Remember, you may be tense and nervous for the first 2 weeks after you quit, but this will pass.   Find new activities to keep your hands busy. Play with a pen, coin, or rubber band. Doodle or draw things on paper.   Brush your teeth right after eating. This will help cut down on the craving for the taste of tobacco after meals. You can try mouthwash, too.   Use oral substitutes, such as lemon drops, carrots, a cinnamon stick, or chewing gum, in place of cigarettes. Keep them handy so they are available when you have the urge to smoke.   When you have the urge to smoke, try deep breathing.   Designate your home as a nonsmoking area.   If you are a heavy smoker, ask your caregiver about a prescription for nicotine chewing gum. It can ease your withdrawal from nicotine.   Reward yourself. Set aside the cigarette money you save and buy yourself something nice.   Look for support from others. Join a support group or smoking cessation program. Ask someone at home or at work to help you with your plan to quit smoking.   Always ask yourself, "Do I need this cigarette or is this just a reflex?" Tell yourself, "Today, I choose not to smoke," or "I do not want to smoke." You are  reminding yourself of your decision to quit, even if you do smoke a cigarette.  HOW WILL I FEEL WHEN I QUIT SMOKING?  The benefits of not smoking start within days of quitting.   You may have symptoms of withdrawal because your body is used to nicotine (the addictive substance in cigarettes). You may crave cigarettes, be irritable, feel very hungry, cough often, get headaches, or have difficulty concentrating.   The withdrawal symptoms are only temporary. They are strongest when you first quit but will go away within 10 to 14 days.   When withdrawal symptoms occur, stay in control. Think about your reasons for quitting. Remind  yourself that these are signs that your body is healing and getting used to being without cigarettes.   Remember that withdrawal symptoms are easier to treat than the major diseases that smoking can cause.   Even after the withdrawal is over, expect periodic urges to smoke. However, these cravings are generally short-lived and will go away whether you smoke or not. Do not smoke!   If you relapse and smoke again, do not lose hope. Most smokers quit 3 times before they are successful.   If you relapse, do not give up! Plan ahead and think about what you will do the next time you get the urge to smoke.  LIFE AS A NONSMOKER: MAKE IT FOR A MONTH, MAKE IT FOR LIFE Day 1: Hang this page where you will see it every day. Day 2: Get rid of all ashtrays, matches, and lighters. Day 3: Drink water. Breathe deeply between sips. Day 4: Avoid places with smoke-filled air, such as bars, clubs, or the smoking section of restaurants. Day 5: Keep track of how much money you save by not smoking. Day 6: Avoid boredom. Keep a good book with you or go to the movies. Day 7: Reward yourself! One week without smoking! Day 8: Make a dental appointment to get your teeth cleaned. Day 9: Decide how you will turn down a cigarette before it is offered to you. Day 10: Review your reasons for quitting. Day 11: Distract yourself. Stay active to keep your mind off smoking and to relieve tension. Take a walk, exercise, read a book, do a crossword puzzle, or try a new hobby. Day 12: Exercise. Get off the bus before your stop or use stairs instead of escalators. Day 13: Call on friends for support and encouragement. Day 14: Reward yourself! Two weeks without smoking! Day 15: Practice deep breathing exercises. Day 16: Bet a friend that you can stay a nonsmoker. Day 17: Ask to sit in nonsmoking sections of restaurants. Day 18: Hang up "No Smoking" signs. Day 19: Think of yourself as a nonsmoker. Day 20: Each morning, tell  yourself you will not smoke. Day 21: Reward yourself! Three weeks without smoking! Day 22: Think of smoking in negative ways. Remember how it stains your teeth, gives you bad breath, and leaves you short of breath. Day 23: Eat a nutritious breakfast. Day 24:Do not relive your days as a smoker. Day 25: Hold a pencil in your hand when talking on the telephone. Day 26: Tell all your friends you do not smoke. Day 27: Think about how much better food tastes. Day 28: Remember, one cigarette is one too many. Day 29: Take up a hobby that will keep your hands busy. Day 30: Congratulations! One month without smoking! Give yourself a big reward. Your caregiver can direct you to community resources or hospitals for support, which  may include:  Group support.   Education.   Hypnosis.   Subliminal therapy.  Document Released: 01/24/2004 Document Revised: 07/20/2011 Document Reviewed: 02/11/2009 [facility] Patient Information 2013 [place].   Diabetes, Type 2 Diabetes is a long-lasting (chronic) disease. In type 2 diabetes, the pancreas does not make enough insulin (a hormone), and the body does not respond normally to the insulin that is made. This type of diabetes was also previously called adult-onset diabetes. It usually occurs after the age of 2, but it can occur at any age.   CAUSES   Type 2 diabetes happens because the pancreas is not making enough insulin or your body has trouble using the insulin that your pancreas does make properly. SYMPTOMS    Drinking more than usual.   Urinating more than usual.   Blurred vision.   Dry, itchy skin.   Frequent infections.   Feeling more tired than usual (fatigue).  DIAGNOSIS The diagnosis of type 2 diabetes is usually made by one of the following tests:  Fasting blood glucose test. You will not eat for at least 8 hours and then take a blood test.   Random blood glucose test. Your blood glucose (sugar) is checked at any time of the day  regardless of when you ate.   Oral glucose tolerance test (OGTT). Your blood glucose is measured after you have not eaten (fasted) and then after you drink a glucose containing beverage.  TREATMENT    Healthy eating.   Exercise.   Medicine, if needed.   Monitoring blood glucose.   Seeing your caregiver regularly.  HOME CARE INSTRUCTIONS    Check your blood glucose at least once a day. More frequent monitoring may be necessary, depending on your medicines and on how well your diabetes is controlled. Your caregiver will advise you.   Take your medicine as directed by your caregiver.   Do not smoke.   Make wise food choices. Ask your caregiver for information. Weight loss can improve your diabetes.   Learn about low blood glucose (hypoglycemia) and how to treat it.   Get your eyes checked regularly.   Have a yearly physical exam. Have your blood pressure checked and your blood and urine tested.   Wear a pendant or bracelet saying that you have diabetes.   Check your feet every night for cuts, sores, blisters, and redness. Let your caregiver know if you have any problems.  SEEK MEDICAL CARE IF:    You have problems keeping your blood glucose in target range.   You have problems with your medicines.   You have symptoms of an illness that do not improve after 24 hours.   You have a sore or wound that is not healing.   You notice a change in vision or a new problem with your vision.   You have a fever.  MAKE SURE YOU:  Understand these instructions.   Will watch your condition.   Will get help right away if you are not doing well or get worse.  Document Released: 04/27/2005 Document Revised: 07/20/2011 Document Reviewed: 10/13/2010 [facility] Patient Information 2013 [place].

## 2012-06-15 NOTE — Progress Notes (Addendum)
  Subjective:    Patient ID: Cassidy Jennings, female    DOB: Nov 10, 1940, 72 y.o.   MRN: [id number]  HPI HTN -  Pt denies chest pain, SOB, dizziness, or heart palpitations.  Taking meds as directed w/o problems.  Denies medication side effects.    Elevated blood sugar- will check A1C today to eval for diabetes.   Tobacco abuse-she says she's trying to cut back some. She is interested in possibly be gone. She's never used before. She knows she needs to quit but she still smoking more than a pack a day and sometimes up to 2 packs a day.  Review of Systems     Objective:   Physical Exam  Constitutional: She is oriented to person, place, and time. She appears well-developed and well-nourished.  HENT:  Head: Normocephalic and atraumatic.  Cardiovascular: Normal rate, regular rhythm and normal heart sounds.   Pulmonary/Chest: Effort normal and breath sounds normal.  Neurological: She is alert and oriented to person, place, and time.  Skin: Skin is warm and dry.  Psychiatric: She has a normal mood and affect. Her behavior is normal.          Assessment & Plan:  HTN - Well controlled.  Continue current regimen. Call if any palms. Followup in 6 months.  She has a colonoscopy scheduled for later this month.   Tob abuse - Has cut back but not ready to quit.  She is interested in using the gum ot help her quit. We discussed how to appropriately use the common if she decides to do this. Over-the-counter which is fantastic. Encouraged her to continue work on it. Handout given.  DM- New dx. discussed that her hemoglobin A1c is 6.5 today. We checked this because her glucose was elevated on her fasting labs recently. I would like to get her in for diabetic nutrition counseling. We discussed reducing carbohydrate intake and avoiding concentrated sweets especially and beverages. She does drink soda. She also drank a lot of cheese. Regular exercise helps as well. I like to see her back in 3 months to  see if she's under control this without medication. We can get her A1c under 6.5 and we can certainly discuss the option of holding off on treatment with medication.

## 2012-06-21 ENCOUNTER — Telehealth: Payer: Self-pay | Admitting: *Deleted

## 2012-06-21 ENCOUNTER — Other Ambulatory Visit: Payer: Self-pay | Admitting: Family Medicine

## 2012-06-21 NOTE — Telephone Encounter (Signed)
Let see if we have any of those booklets left that  Have the carb info in them. If not then we can go to the American diabetic Association website and see if they have any concise printouts.

## 2012-06-21 NOTE — Telephone Encounter (Signed)
Pt calls and would like to know foods she can eat having diabetes- she has no computer

## 2012-06-22 NOTE — Telephone Encounter (Signed)
Mailed information to patient

## 2012-06-24 ENCOUNTER — Telehealth: Payer: Self-pay | Admitting: *Deleted

## 2012-06-24 NOTE — Telephone Encounter (Signed)
Called pt to find out if she was going to reschedule her appt with [facility]. I gave pt their phone # [phone] ext [phone] and she will call.Cassidy Jennings [place]

## 2012-07-06 ENCOUNTER — Encounter: Payer: Self-pay | Admitting: *Deleted

## 2012-08-22 ENCOUNTER — Other Ambulatory Visit: Payer: Self-pay | Admitting: Family Medicine

## 2012-09-12 ENCOUNTER — Encounter: Payer: Self-pay | Admitting: Family Medicine

## 2012-09-12 ENCOUNTER — Ambulatory Visit (INDEPENDENT_AMBULATORY_CARE_PROVIDER_SITE_OTHER): Payer: Medicare Other | Admitting: Family Medicine

## 2012-09-12 VITALS — BP 136/64 | HR 90 | Wt 138.0 lb

## 2012-09-12 DIAGNOSIS — R7301 Impaired fasting glucose: Secondary | ICD-10-CM | POA: Diagnosis not present

## 2012-09-12 DIAGNOSIS — I10 Essential (primary) hypertension: Secondary | ICD-10-CM | POA: Diagnosis not present

## 2012-09-12 DIAGNOSIS — E119 Type 2 diabetes mellitus without complications: Secondary | ICD-10-CM

## 2012-09-12 DIAGNOSIS — R7303 Prediabetes: Secondary | ICD-10-CM | POA: Insufficient documentation

## 2012-09-12 DIAGNOSIS — F17200 Nicotine dependence, unspecified, uncomplicated: Secondary | ICD-10-CM | POA: Diagnosis not present

## 2012-09-12 DIAGNOSIS — Z720 Tobacco use: Secondary | ICD-10-CM

## 2012-09-12 LAB — POCT GLYCOSYLATED HEMOGLOBIN (HGB A1C): Hemoglobin A1C: 6.5

## 2012-09-12 NOTE — Patient Instructions (Signed)
Remember to get your eye exam.

## 2012-09-12 NOTE — Progress Notes (Signed)
  Subjective:    Patient ID: Cassidy Jennings, female    DOB: 12/02/1940, 72 y.o.   MRN: [id number]  HPI DM- Cut out her soda. And has been baking and broiling. Has cut out red meats.  She was never contacted about diabetes and nutritions counseling.  Exercise daily. She has been eating cheerios.   HTN- Pt denies chest pain, SOB, dizziness, or heart palpitations.  Taking meds as directed w/o problems.  Denies medication side effects.    Review of Systems     Objective:   Physical Exam  Constitutional: She is oriented to person, place, and time. She appears well-developed and well-nourished.  HENT:  Head: Normocephalic and atraumatic.  Cardiovascular: Normal rate, regular rhythm and normal heart sounds.   Pulmonary/Chest: Effort normal and breath sounds normal.  Neurological: She is alert and oriented to person, place, and time.  Skin: Skin is warm and dry.  Psychiatric: She has a normal mood and affect. Her behavior is normal.          Assessment & Plan:  DM- Doing well. Stable. Want to get her under 6.5 so we can hold off on medicaiton. Will get her in for nutritions counseling. Followup in 3 months. Continue work on diet and exercise. She is really made some fantastic changes. Lab Results  Component Value Date   HGBA1C 6.5 09/12/2012    HTN - well controlled. Continue current regimen. Followup in 6 months.  Tob abuse - Down to 4-5 cig per day. Suspect COPD. Recommend spirometry.

## 2012-09-21 ENCOUNTER — Other Ambulatory Visit: Payer: Self-pay | Admitting: Family Medicine

## 2012-11-14 ENCOUNTER — Other Ambulatory Visit: Payer: Self-pay | Admitting: Family Medicine

## 2012-11-17 ENCOUNTER — Other Ambulatory Visit: Payer: Self-pay

## 2012-12-19 ENCOUNTER — Ambulatory Visit: Payer: Medicare Other | Admitting: Family Medicine

## 2012-12-19 DIAGNOSIS — Z0289 Encounter for other administrative examinations: Secondary | ICD-10-CM

## 2013-02-11 ENCOUNTER — Other Ambulatory Visit: Payer: Self-pay | Admitting: Family Medicine

## 2013-03-29 ENCOUNTER — Other Ambulatory Visit: Payer: Self-pay | Admitting: Family Medicine

## 2013-05-09 ENCOUNTER — Other Ambulatory Visit: Payer: Self-pay | Admitting: Family Medicine

## 2013-05-09 ENCOUNTER — Telehealth: Payer: Self-pay | Admitting: *Deleted

## 2013-05-09 NOTE — Telephone Encounter (Signed)
LM on VM informing pt that we are sending 2 weeks worth of medication due to being passed due for an appt with provider.  Oscar La, LPN

## 2013-06-21 ENCOUNTER — Encounter: Payer: Self-pay | Admitting: Family Medicine

## 2013-06-21 ENCOUNTER — Ambulatory Visit (INDEPENDENT_AMBULATORY_CARE_PROVIDER_SITE_OTHER): Payer: 59 | Admitting: Family Medicine

## 2013-06-21 VITALS — BP 172/92 | HR 90 | Wt 132.0 lb

## 2013-06-21 DIAGNOSIS — E785 Hyperlipidemia, unspecified: Secondary | ICD-10-CM

## 2013-06-21 DIAGNOSIS — R350 Frequency of micturition: Secondary | ICD-10-CM

## 2013-06-21 DIAGNOSIS — I10 Essential (primary) hypertension: Secondary | ICD-10-CM

## 2013-06-21 MED ORDER — SIMVASTATIN 40 MG PO TABS: ORAL_TABLET | ORAL | Status: DC

## 2013-06-21 MED ORDER — CLONIDINE HCL 0.2 MG PO TABS
0.2000 mg | ORAL_TABLET | Freq: Once | ORAL | Status: AC
  Administered 2013-06-21: 0.2 mg via ORAL

## 2013-06-21 MED ORDER — LOSARTAN POTASSIUM-HCTZ 100-12.5 MG PO TABS: ORAL_TABLET | ORAL | Status: DC

## 2013-06-22 LAB — URINALYSIS, ROUTINE W REFLEX MICROSCOPIC
Bilirubin Urine: NEGATIVE
Glucose, UA: NEGATIVE mg/dL
HGB URINE DIPSTICK: NEGATIVE
Ketones, ur: NEGATIVE mg/dL
LEUKOCYTES UA: NEGATIVE
NITRITE: NEGATIVE
PROTEIN: NEGATIVE mg/dL
Specific Gravity, Urine: 1.011 (ref 1.005–1.030)
Urobilinogen, UA: 0.2 mg/dL (ref 0.0–1.0)
pH: 8 (ref 5.0–8.0)

## 2013-06-22 NOTE — Progress Notes (Signed)
CC: Cassidy Jennings is a 73 y.o. female is here for ER f/u   Subjective: HPI:  Patient presents for complaint of elevated blood pressure that has been present for the past month since running out of her losartan-hydrochlorothiazide due to not following up with our clinic. Her daughter was concerned about her and took her to [facility] emergency room earlier this afternoon. On presentation she had a systolic of [id number], she was given clonidine and Ativan which brought her systolic down to [id number] at time of discharge approximately one and a half hours after presentation. Workup included CT scan of the brain which was unremarkable, unremarkable CBC, CMP remarkable only for mild hyperglycemia,, unremarkable EKG. She was given refills on the above medications. On presentation she describes a headache that is now resolved. She has difficulty describing the headache localizing it and describing what may better however it was worse with leaning forward. Nothing else made better or worse. Was not accompanied by any motor or sensory disturbances.  She denies any recent or remote chest pain, shortness of breath, orthopnea, peripheral edema, limb claudication. She states that she feels great right now other than being fatigued ever since receiving either clonidine and/or Ativan.  On further questioning she reports that over the past week she has been urinating more often than she is used to cover denies dysuria or urgency.   Review Of Systems Outlined In HPI  Past Medical History  Diagnosis Date  . Hypertension   . Hyperlipidemia     Past Surgical History  Procedure Laterality Date  . Tubal ligation     Family History  Problem Relation Age of Onset  . Hypertension Mother   . Heart attack Mother 14    History   Social History  . Marital Status: Widowed    Spouse Name: N/A    Number of Children: 6  . Years of Education: N/A   Occupational History  . Not on file.   Social History Main Topics   . Smoking status: Current Every Day Smoker -- 0.25 packs/day for 55 years    Types: Cigarettes  . Smokeless tobacco: Not on file  . Alcohol Use: No  . Drug Use: No  . Sexual Activity: Not Currently   Other Topics Concern  . Not on file   Social History Narrative   She smokes but has multiple family members that smoke. Stays active but no regular exercise.      Objective: BP 172/92  Pulse 90  Wt 132 lb (59.875 kg)  General: Alert and Oriented, No Acute Distress HEENT: Pupils equal, round, reactive to light. Conjunctivae clear.  External ears unremarkable, canals clear with intact TMs with appropriate landmarks.  Middle ear appears open without effusion. Pink inferior turbinates.  Moist mucous membranes, pharynx without inflammation nor lesions.  Neck supple without palpable lymphadenopathy nor abnormal masses. Lungs: Clear to auscultation bilaterally, no wheezing/ronchi/rales.  Comfortable work of breathing. Good air movement. Cardiac: Regular rate and rhythm. Normal S1/S2.  No murmurs, rubs, nor gallops.   Cranial nerves II through XII grossly intact Extremities: No peripheral edema.  Strong peripheral pulses.  Mental Status: No depression, anxiety, nor agitation. Skin: Warm and dry.  Assessment & Plan: Cassidy Jennings was seen today for er f/u.  Diagnoses and associated orders for this visit:  HYPERTENSION, BENIGN - losartan-hydrochlorothiazide (HYZAAR) 100-12.5 MG per tablet; take 1 tablet by mouth once daily - cloNIDine (CATAPRES) tablet 0.2 mg; Take 1 tablet (0.2 mg total) by mouth once.  Hyperlipidemia - simvastatin (ZOCOR) 40 MG tablet; take 1 tablet by mouth at bedtime  Urinary frequency - Urinalysis, Routine w reflex microscopic - Urine Culture    Hypertension: Uncontrolled she was given another dose of clonidine here in directed to go to her pharmacy where her refills for confirmed to be ready for picking up she was encouraged to take this medication immediately and to  keep a log of blood pressures and return in 1-2 weeks for repeat blood pressure check, Hyperlipidemia: She has run out of her simvastatin so we have refilled this I encouraged her to have her repeat the panel in 3 months Urinary frequency: Urinalysis and urine culture was obtained to evaluate for urinary tract infection   25 minutes spent face-to-face during visit today of which at least 50% was counseling or coordinating care regarding: 1. HYPERTENSION, BENIGN   2. Hyperlipidemia   3. Urinary frequency      Return for 1-2 weeks for blood pressure check.

## 2013-06-23 ENCOUNTER — Telehealth: Payer: Self-pay | Admitting: *Deleted

## 2013-06-23 LAB — URINE CULTURE
Colony Count: NO GROWTH
Organism ID, Bacteria: NO GROWTH

## 2013-06-23 NOTE — Telephone Encounter (Signed)
Pt's daughter Aniceto Boss called and stated that her mother cant get comfortable, she feels jittery and her hands are clammy. She reports that she has been feeling like this since this morning. I asked if she has eaten today she stated that she had a peanut butter and jelly sandwich for lunch and has not had anything else since then. She stated that she does not have an appetite. She is drinking fluids. They wanted to know if this is a side effect of the medication that she is taking. Please advise.Audelia Hives [place]

## 2013-06-23 NOTE — Telephone Encounter (Signed)
I can't imagine that it would be since she's been on both medications that were refilled earlier this week.  She should consider being seen at urgent care if this has been bothering her since the morning.

## 2013-06-23 NOTE — Telephone Encounter (Signed)
Informed pt's daughter of recommendations. She voiced understanding and agreed.Cassidy Jennings [place]

## 2013-06-29 ENCOUNTER — Ambulatory Visit: Payer: 59 | Admitting: Family Medicine

## 2013-07-04 ENCOUNTER — Ambulatory Visit: Payer: 59 | Admitting: Family Medicine

## 2013-07-04 DIAGNOSIS — Z0289 Encounter for other administrative examinations: Secondary | ICD-10-CM

## 2013-10-20 ENCOUNTER — Other Ambulatory Visit: Payer: Self-pay | Admitting: *Deleted

## 2013-10-20 DIAGNOSIS — E785 Hyperlipidemia, unspecified: Secondary | ICD-10-CM

## 2013-10-20 DIAGNOSIS — I10 Essential (primary) hypertension: Secondary | ICD-10-CM

## 2013-10-20 MED ORDER — LOSARTAN POTASSIUM-HCTZ 100-12.5 MG PO TABS: ORAL_TABLET | ORAL | Status: DC

## 2013-10-20 MED ORDER — SIMVASTATIN 40 MG PO TABS: ORAL_TABLET | ORAL | Status: DC

## 2013-12-20 ENCOUNTER — Other Ambulatory Visit: Payer: Self-pay | Admitting: Family Medicine

## 2014-01-25 ENCOUNTER — Other Ambulatory Visit: Payer: Self-pay | Admitting: Family Medicine

## 2014-01-26 DIAGNOSIS — S79919A Unspecified injury of unspecified hip, initial encounter: Secondary | ICD-10-CM | POA: Diagnosis not present

## 2014-01-26 DIAGNOSIS — F17200 Nicotine dependence, unspecified, uncomplicated: Secondary | ICD-10-CM | POA: Diagnosis not present

## 2014-01-26 DIAGNOSIS — M25559 Pain in unspecified hip: Secondary | ICD-10-CM | POA: Diagnosis not present

## 2014-01-26 DIAGNOSIS — M79609 Pain in unspecified limb: Secondary | ICD-10-CM | POA: Diagnosis not present

## 2014-01-26 DIAGNOSIS — IMO0002 Reserved for concepts with insufficient information to code with codable children: Secondary | ICD-10-CM | POA: Diagnosis not present

## 2014-02-13 ENCOUNTER — Telehealth: Payer: Self-pay | Admitting: Family Medicine

## 2014-02-13 NOTE — Telephone Encounter (Signed)
Ms. Distler's daughter-in-law called. Mrs. Barut wants to switch to Dr. Ileene Rubens.

## 2014-02-13 NOTE — Telephone Encounter (Signed)
I'm ok with this but it's ultimately up to Dr. Madilyn Fireman.

## 2014-02-14 ENCOUNTER — Ambulatory Visit (INDEPENDENT_AMBULATORY_CARE_PROVIDER_SITE_OTHER): Payer: Medicare Other | Admitting: Family Medicine

## 2014-02-14 ENCOUNTER — Encounter: Payer: Self-pay | Admitting: Family Medicine

## 2014-02-14 VITALS — BP 191/83 | HR 93 | Temp 98.2°F | Wt 137.0 lb

## 2014-02-14 DIAGNOSIS — J069 Acute upper respiratory infection, unspecified: Secondary | ICD-10-CM

## 2014-02-14 DIAGNOSIS — E785 Hyperlipidemia, unspecified: Secondary | ICD-10-CM | POA: Diagnosis not present

## 2014-02-14 DIAGNOSIS — I10 Essential (primary) hypertension: Secondary | ICD-10-CM

## 2014-02-14 MED ORDER — ATORVASTATIN CALCIUM 20 MG PO TABS: 20.0000 mg | ORAL_TABLET | Freq: Every day | ORAL | Status: DC

## 2014-02-14 MED ORDER — LOSARTAN POTASSIUM-HCTZ 100-12.5 MG PO TABS: ORAL_TABLET | ORAL | Status: DC

## 2014-02-14 NOTE — Telephone Encounter (Signed)
That is fine. She does need to schedule an appointment soon to followup on her diabetes. If she already  has one scheduled with me a nd let's make sure it gets switched over to Dr. Ileene Rubens schedule.

## 2014-02-14 NOTE — Progress Notes (Signed)
CC: Cassidy Jennings is a 73 y.o. female is here for Hypertension and cough and congestion   Subjective: HPI:  Complains of cough and nasal congestion that has been present for the last week that came on abruptly and over the past 5 days has significantly been improving she believes that only a percentage if it remains however she's worried that it may linger. Symptoms are greatly improved the Coricidin HBP for to 6 hours. She had some wheezing and shortness of breath however this is not fully resolved. Cough is nonproductive. She denies fevers, chills, and facial pressure, sore throat or ear pain  Followup essential hypertension: She ran out of Hyzaar 2 days ago no outside blood pressures report while taking this medication. She denies any known side effects or intolerance to this medication. Today she does not have a headache but she reports in the past that went she runs out of this medication she has a hard to localize headache that improves as soon she restarts the medication. She denies chest pain, irregular heartbeat, nor peripheral edema  Complains that when she was taking simvastatin she would have acid reflux for hours after taking the medication. When she stopped taking the medication symptoms completely resolved within a day. She wants no further sentinel she can take for cholesterol   Review Of Systems Outlined In HPI  Past Medical History  Diagnosis Date  . Hypertension   . Hyperlipidemia     Past Surgical History  Procedure Laterality Date  . Tubal ligation     Family History  Problem Relation Age of Onset  . Hypertension Mother   . Heart attack Mother 31    History   Social History  . Marital Status: Widowed    Spouse Name: N/A    Number of Children: 6  . Years of Education: N/A   Occupational History  . Not on file.   Social History Main Topics  . Smoking status: Current Every Day Smoker -- 0.25 packs/day for 55 years    Types: Cigarettes  . Smokeless  tobacco: Not on file  . Alcohol Use: No  . Drug Use: No  . Sexual Activity: Not Currently   Other Topics Concern  . Not on file   Social History Narrative   She smokes but has multiple family members that smoke. Stays active but no regular exercise.      Objective: BP 191/83  Pulse 93  Temp(Src) 98.2 F (36.8 C) (Oral)  Wt 137 lb (62.143 kg)  SpO2 96%  General: Alert and Oriented, No Acute Distress HEENT: Pupils equal, round, reactive to light. Conjunctivae clear.  External ears unremarkable, canals clear with intact TMs with appropriate landmarks.  Middle ear appears open without effusion. Pink inferior turbinates.  Moist mucous membranes, pharynx without inflammation nor lesions.  Neck supple without palpable lymphadenopathy nor abnormal masses. Lungs: Clear to auscultation bilaterally, no wheezing/ronchi/rales.  Comfortable work of breathing. Good air movement. Cardiac: Regular rate and rhythm. Normal S1/S2.  No murmurs, rubs, nor gallops.   Extremities: No peripheral edema.  Strong peripheral pulses.  Mental Status: No depression, anxiety, nor agitation. Skin: Warm and dry.  Assessment & Plan: Cassidy Jennings was seen today for hypertension and cough and congestion.  Diagnoses and associated orders for this visit:  Essential hypertension - losartan-hydrochlorothiazide (HYZAAR) 100-12.5 MG per tablet; take 1 tablet by mouth once daily  Hyperlipidemia - atorvastatin (LIPITOR) 20 MG tablet; Take 1 tablet (20 mg total) by mouth daily.  Viral URI  Viral URI: Reassurance provided that this should resolve within the next 4-5 days and that she can continue with Coricidin HBP Hyperlipidemia: Switching to atorvastatin, we'll check liver enzymes and cholesterol in 1-2 months Essential hypertension: Uncontrolled chronic condition restart Hyzaar and return in one month for renal function and diabetic followup   Return in about 4 weeks (around 03/14/2014) for Diabetes and Cholesterol  and HTN.

## 2014-02-14 NOTE — Telephone Encounter (Signed)
Cassidy Jennings will remind pt today to schedule a diabetic followup.

## 2014-03-16 DIAGNOSIS — Z23 Encounter for immunization: Secondary | ICD-10-CM | POA: Diagnosis not present

## 2014-05-24 ENCOUNTER — Other Ambulatory Visit: Payer: Self-pay | Admitting: *Deleted

## 2014-05-24 DIAGNOSIS — I10 Essential (primary) hypertension: Secondary | ICD-10-CM

## 2014-05-24 MED ORDER — LOSARTAN POTASSIUM-HCTZ 100-12.5 MG PO TABS: ORAL_TABLET | ORAL | Status: DC

## 2014-07-03 ENCOUNTER — Other Ambulatory Visit: Payer: Self-pay | Admitting: Family Medicine

## 2014-07-05 ENCOUNTER — Other Ambulatory Visit: Payer: Self-pay | Admitting: *Deleted

## 2014-07-05 DIAGNOSIS — I10 Essential (primary) hypertension: Secondary | ICD-10-CM

## 2014-07-05 MED ORDER — LOSARTAN POTASSIUM-HCTZ 100-12.5 MG PO TABS: ORAL_TABLET | ORAL | Status: DC

## 2014-07-13 ENCOUNTER — Encounter: Payer: Self-pay | Admitting: Family Medicine

## 2014-07-13 ENCOUNTER — Ambulatory Visit (INDEPENDENT_AMBULATORY_CARE_PROVIDER_SITE_OTHER): Payer: Medicare Other | Admitting: Family Medicine

## 2014-07-13 VITALS — BP 130/80 | HR 90 | Wt 132.0 lb

## 2014-07-13 DIAGNOSIS — Z720 Tobacco use: Secondary | ICD-10-CM | POA: Diagnosis not present

## 2014-07-13 DIAGNOSIS — I10 Essential (primary) hypertension: Secondary | ICD-10-CM

## 2014-07-13 DIAGNOSIS — E785 Hyperlipidemia, unspecified: Secondary | ICD-10-CM | POA: Diagnosis not present

## 2014-07-13 DIAGNOSIS — E119 Type 2 diabetes mellitus without complications: Secondary | ICD-10-CM | POA: Diagnosis not present

## 2014-07-13 DIAGNOSIS — Z1231 Encounter for screening mammogram for malignant neoplasm of breast: Secondary | ICD-10-CM | POA: Diagnosis not present

## 2014-07-13 DIAGNOSIS — R0989 Other specified symptoms and signs involving the circulatory and respiratory systems: Secondary | ICD-10-CM | POA: Diagnosis not present

## 2014-07-13 LAB — POCT GLYCOSYLATED HEMOGLOBIN (HGB A1C): Hemoglobin A1C: 6.1

## 2014-07-13 MED ORDER — ATORVASTATIN CALCIUM 20 MG PO TABS: 20.0000 mg | ORAL_TABLET | Freq: Every day | ORAL | Status: DC

## 2014-07-13 MED ORDER — LOSARTAN POTASSIUM-HCTZ 100-12.5 MG PO TABS: ORAL_TABLET | ORAL | Status: DC

## 2014-07-13 MED ORDER — AMBULATORY NON FORMULARY MEDICATION: Status: DC

## 2014-07-13 NOTE — Progress Notes (Signed)
   Subjective:    Patient ID: Cassidy Jennings, female    DOB: [birth year], 74 y.o.   MRN: [id number]  HPI Hypertension- Pt denies chest pain, SOB, dizziness, or heart palpitations.  Taking meds as directed w/o problems.  Denies medication side effects.    Diabetes -patient has not followed up for her diabetes in well over a year. no hypoglycemic events. No wounds or sores that are not healing well. No increased thirst or urination. Checking glucose at home. Taking medications as prescribed without any side effects. Last eye exam was a couple of years ago.  Last mammogram was in 2013.  Review of Systems     Objective:   Physical Exam  Constitutional: She is oriented to person, place, and time. She appears well-developed and well-nourished.  HENT:  Head: Normocephalic and atraumatic.  Cardiovascular: Normal rate, regular rhythm and normal heart sounds.   Carotid bruit on the left. None on the right. No renal bruits.  Pulmonary/Chest: Effort normal and breath sounds normal.  Neurological: She is alert and oriented to person, place, and time.  Skin: Skin is warm and dry.  Psychiatric: She has a normal mood and affect. Her behavior is normal.          Assessment & Plan:  HTN- well controlled.  Continue current regimen. Follow up in 6 months.  Dm- well controlled. A1C is down to 6.1. She is on a fantastic job. She's currently controlled with diet and exercise. Her shifts due for eye exam. Her daughter is here with her today and says she will get it scheduled. I'll see her back in 6 months.  Due for mammogram. Scheduled.  Left carotid bruit-new finding on exam today. Will schedule for carotid Dopplers. Discussed the importance of controlling blood pressure, cholesterol in quitting smoking.   Tobacco abuse-encourage smoking cessation.

## 2014-07-17 DIAGNOSIS — E119 Type 2 diabetes mellitus without complications: Secondary | ICD-10-CM | POA: Diagnosis not present

## 2014-07-17 DIAGNOSIS — E785 Hyperlipidemia, unspecified: Secondary | ICD-10-CM | POA: Diagnosis not present

## 2014-07-17 DIAGNOSIS — I10 Essential (primary) hypertension: Secondary | ICD-10-CM | POA: Diagnosis not present

## 2014-07-18 ENCOUNTER — Other Ambulatory Visit: Payer: Self-pay | Admitting: *Deleted

## 2014-07-18 DIAGNOSIS — R7989 Other specified abnormal findings of blood chemistry: Secondary | ICD-10-CM

## 2014-07-18 LAB — LIPID PANEL
CHOL/HDL RATIO: 5.4 ratio
Cholesterol: 158 mg/dL (ref 0–200)
HDL: 29 mg/dL — AB (ref >=46)
LDL CALC: 69 mg/dL (ref 0–99)
Triglycerides: 300 mg/dL — ABNORMAL HIGH (ref <150)
VLDL: 60 mg/dL — ABNORMAL HIGH (ref 0–40)

## 2014-07-18 LAB — COMPLETE METABOLIC PANEL WITH GFR
ALT: 15 U/L (ref 0–35)
AST: 16 U/L (ref 0–37)
Albumin: 3.7 g/dL (ref 3.5–5.2)
Alkaline Phosphatase: 81 U/L (ref 39–117)
BUN: 20 mg/dL (ref 6–23)
CHLORIDE: 98 meq/L (ref 96–112)
CO2: 29 meq/L (ref 19–32)
CREATININE: 1.32 mg/dL — AB (ref 0.50–1.10)
Calcium: 9 mg/dL (ref 8.4–10.5)
GFR, Est African American: 46 mL/min — ABNORMAL LOW
GFR, Est Non African American: 40 mL/min — ABNORMAL LOW
Glucose, Bld: 104 mg/dL — ABNORMAL HIGH (ref 70–99)
POTASSIUM: 4.3 meq/L (ref 3.5–5.3)
Sodium: 135 mEq/L (ref 135–145)
Total Bilirubin: 0.4 mg/dL (ref 0.2–1.2)
Total Protein: 6.9 g/dL (ref 6.0–8.3)

## 2014-07-18 LAB — TSH: TSH: 4.334 u[IU]/mL (ref 0.350–4.500)

## 2014-07-20 ENCOUNTER — Ambulatory Visit (HOSPITAL_BASED_OUTPATIENT_CLINIC_OR_DEPARTMENT_OTHER): Payer: Medicare Other

## 2014-07-26 ENCOUNTER — Ambulatory Visit (HOSPITAL_BASED_OUTPATIENT_CLINIC_OR_DEPARTMENT_OTHER)
Admission: RE | Admit: 2014-07-26 | Discharge: 2014-07-26 | Disposition: A | Discharge status: Home / Self Care | Payer: Medicare Other | Source: Ambulatory Visit | Attending: Family Medicine | Admitting: Family Medicine

## 2014-07-26 DIAGNOSIS — I6523 Occlusion and stenosis of bilateral carotid arteries: Secondary | ICD-10-CM | POA: Diagnosis not present

## 2014-07-26 DIAGNOSIS — R0989 Other specified symptoms and signs involving the circulatory and respiratory systems: Secondary | ICD-10-CM | POA: Diagnosis present

## 2014-08-01 ENCOUNTER — Other Ambulatory Visit: Payer: Self-pay | Admitting: Family Medicine

## 2014-08-01 DIAGNOSIS — E785 Hyperlipidemia, unspecified: Secondary | ICD-10-CM

## 2014-08-01 DIAGNOSIS — I739 Peripheral vascular disease, unspecified: Secondary | ICD-10-CM

## 2014-08-01 DIAGNOSIS — I779 Disorder of arteries and arterioles, unspecified: Secondary | ICD-10-CM | POA: Insufficient documentation

## 2014-08-01 MED ORDER — ATORVASTATIN CALCIUM 40 MG PO TABS: 40.0000 mg | ORAL_TABLET | Freq: Every day | ORAL | Status: DC

## 2014-08-02 ENCOUNTER — Other Ambulatory Visit: Payer: Self-pay | Admitting: Family Medicine

## 2014-08-02 DIAGNOSIS — I6523 Occlusion and stenosis of bilateral carotid arteries: Secondary | ICD-10-CM

## 2014-08-10 ENCOUNTER — Other Ambulatory Visit: Payer: Self-pay

## 2014-08-10 DIAGNOSIS — I6522 Occlusion and stenosis of left carotid artery: Secondary | ICD-10-CM

## 2014-09-06 ENCOUNTER — Encounter: Payer: Self-pay | Admitting: Vascular Surgery

## 2014-09-07 ENCOUNTER — Other Ambulatory Visit (HOSPITAL_COMMUNITY): Payer: Medicare Other

## 2014-09-07 ENCOUNTER — Encounter: Payer: Medicare Other | Admitting: Vascular Surgery

## 2014-10-02 ENCOUNTER — Other Ambulatory Visit: Payer: Self-pay | Admitting: Vascular Surgery

## 2014-10-02 DIAGNOSIS — I6522 Occlusion and stenosis of left carotid artery: Secondary | ICD-10-CM

## 2014-10-03 ENCOUNTER — Encounter: Payer: Self-pay | Admitting: Vascular Surgery

## 2014-10-04 ENCOUNTER — Telehealth: Payer: Self-pay | Admitting: *Deleted

## 2014-10-04 NOTE — Telephone Encounter (Signed)
Pt wants something to help her to stop smoking. Will fwd to pcp for advice.Cassidy Jennings

## 2014-10-05 ENCOUNTER — Encounter: Payer: Medicare Other | Admitting: Vascular Surgery

## 2014-10-05 ENCOUNTER — Encounter (HOSPITAL_COMMUNITY): Payer: Medicare Other

## 2014-10-05 NOTE — Telephone Encounter (Signed)
Needs app to discuss options.

## 2014-10-05 NOTE — Telephone Encounter (Signed)
Patient scheduled.

## 2014-10-15 ENCOUNTER — Encounter: Payer: Self-pay | Admitting: Vascular Surgery

## 2014-10-15 ENCOUNTER — Ambulatory Visit: Payer: Medicare Other | Admitting: Family Medicine

## 2014-10-16 ENCOUNTER — Encounter: Payer: Self-pay | Admitting: Vascular Surgery

## 2014-10-17 ENCOUNTER — Encounter (HOSPITAL_COMMUNITY): Payer: Medicare Other

## 2014-10-17 ENCOUNTER — Encounter: Payer: Medicare Other | Admitting: Vascular Surgery

## 2014-12-26 DIAGNOSIS — Z79899 Other long term (current) drug therapy: Secondary | ICD-10-CM | POA: Diagnosis not present

## 2014-12-26 DIAGNOSIS — R9431 Abnormal electrocardiogram [ECG] [EKG]: Secondary | ICD-10-CM | POA: Diagnosis not present

## 2014-12-26 DIAGNOSIS — E785 Hyperlipidemia, unspecified: Secondary | ICD-10-CM | POA: Diagnosis not present

## 2014-12-26 DIAGNOSIS — F17210 Nicotine dependence, cigarettes, uncomplicated: Secondary | ICD-10-CM | POA: Diagnosis not present

## 2014-12-26 DIAGNOSIS — I10 Essential (primary) hypertension: Secondary | ICD-10-CM | POA: Diagnosis not present

## 2014-12-26 DIAGNOSIS — R51 Headache: Secondary | ICD-10-CM | POA: Diagnosis not present

## 2014-12-26 DIAGNOSIS — Z9114 Patient's other noncompliance with medication regimen: Secondary | ICD-10-CM | POA: Diagnosis not present

## 2014-12-26 DIAGNOSIS — R030 Elevated blood-pressure reading, without diagnosis of hypertension: Secondary | ICD-10-CM | POA: Diagnosis not present

## 2014-12-30 DIAGNOSIS — E785 Hyperlipidemia, unspecified: Secondary | ICD-10-CM | POA: Diagnosis not present

## 2014-12-30 DIAGNOSIS — Z79899 Other long term (current) drug therapy: Secondary | ICD-10-CM | POA: Diagnosis not present

## 2014-12-30 DIAGNOSIS — Z7982 Long term (current) use of aspirin: Secondary | ICD-10-CM | POA: Diagnosis not present

## 2014-12-30 DIAGNOSIS — R52 Pain, unspecified: Secondary | ICD-10-CM | POA: Diagnosis not present

## 2014-12-30 DIAGNOSIS — Z888 Allergy status to other drugs, medicaments and biological substances status: Secondary | ICD-10-CM | POA: Diagnosis not present

## 2014-12-30 DIAGNOSIS — F17210 Nicotine dependence, cigarettes, uncomplicated: Secondary | ICD-10-CM | POA: Diagnosis not present

## 2014-12-30 DIAGNOSIS — I10 Essential (primary) hypertension: Secondary | ICD-10-CM | POA: Diagnosis not present

## 2014-12-30 DIAGNOSIS — R51 Headache: Secondary | ICD-10-CM | POA: Diagnosis not present

## 2014-12-31 ENCOUNTER — Ambulatory Visit: Payer: Medicare Other | Admitting: Family Medicine

## 2014-12-31 ENCOUNTER — Ambulatory Visit (INDEPENDENT_AMBULATORY_CARE_PROVIDER_SITE_OTHER): Payer: Medicare Other | Admitting: Family Medicine

## 2014-12-31 ENCOUNTER — Encounter: Payer: Self-pay | Admitting: Family Medicine

## 2014-12-31 VITALS — BP 151/76 | HR 80 | Ht 63.25 in | Wt 131.0 lb

## 2014-12-31 DIAGNOSIS — I6522 Occlusion and stenosis of left carotid artery: Secondary | ICD-10-CM | POA: Diagnosis not present

## 2014-12-31 DIAGNOSIS — I10 Essential (primary) hypertension: Secondary | ICD-10-CM

## 2014-12-31 DIAGNOSIS — R634 Abnormal weight loss: Secondary | ICD-10-CM | POA: Diagnosis not present

## 2014-12-31 NOTE — Assessment & Plan Note (Signed)
Review of weight shows essentially stable weight of 132-137 pounds for the past 4 years. The patient's concern about diet will refer to a registered dietitian here in [place].

## 2014-12-31 NOTE — Progress Notes (Signed)
Cassidy Jennings is a 74 y.o. female who presents to [facility]  today for follow-up hypertension. Patient was recently seen at [facility] emergency room for headache. She was seen twice. Both times her blood pressure was elevated. She did not restart her losartan hydrochlorothiazide until the second visit a day ago. She no longer has a headache. She denies any chest pain palpitations or shortness of breath. She feels well.  Additionally patient would like information about nutrition. She notes that she's lost some weight recently and is worried that she is too thin. She notes that she does not eat very much. Her daughter cooks for her. The weight loss somewhat intentional.   Past Medical History  Diagnosis Date  . Hypertension   . Hyperlipidemia    Past Surgical History  Procedure Laterality Date  . Tubal ligation     Social History  Substance Use Topics  . Smoking status: Current Every Day Smoker -- 0.25 packs/day for 55 years    Types: Cigarettes  . Smokeless tobacco: Not on file  . Alcohol Use: No   ROS as above Medications: Current Outpatient Prescriptions  Medication Sig Dispense Refill  . AMBULATORY NON FORMULARY MEDICATION Medication Name: Glucometer with lancets and strips. Dx diabetes, not on insulin therapy.  Test once a day. Box of 100. 1 Units 0  . atorvastatin (LIPITOR) 40 MG tablet Take 1 tablet (40 mg total) by mouth daily. 90 tablet 3  . losartan-hydrochlorothiazide (HYZAAR) 100-12.5 MG per tablet take 1 tablet by mouth once daily - OFFICE APPOINTMENT NEEDED FOR FURTHER REFILLS 90 tablet 1   No current facility-administered medications for this visit.   Allergies  Allergen Reactions  . Simvastatin     gerd     Exam:  BP 151/76 mmHg  Pulse 80  Ht 5' 3.25" (1.607 m)  Wt 131 lb (59.421 kg)  BMI 23.01 kg/m2  Filed Weights   12/31/14 1448  Weight: 131 lb (59.421 kg)    Gen: Well NAD HEENT: EOMI,  MMM Lungs: Normal  work of breathing. CTABL Heart: RRR no MRG Abd: NABS, Soft. Nondistended, Nontender Exts: Brisk capillary refill, warm and well perfused.   No results found for this or any previous visit (from the past 24 hour(s)). No results found.   Please see individual assessment and plan sections.

## 2014-12-31 NOTE — Assessment & Plan Note (Signed)
Doing well. Not quite at goal. We'll recheck in a few weeks. At that time if still high will had a second medicine. Check labs today.

## 2014-12-31 NOTE — Patient Instructions (Signed)
Thank you for coming in today. Continue the blood pressure medicine.,  Return for recheck in 1 month.  If you are worried about diet please follow up with   Trixie Deis, [facility] Registered Dietitian [address] [place], [place] [address] Office [phone]  Get labs today.

## 2015-01-01 ENCOUNTER — Telehealth: Payer: Self-pay | Admitting: Family Medicine

## 2015-01-01 ENCOUNTER — Encounter: Payer: Self-pay | Admitting: Family Medicine

## 2015-01-01 DIAGNOSIS — N183 Chronic kidney disease, stage 3 unspecified: Secondary | ICD-10-CM | POA: Insufficient documentation

## 2015-01-01 LAB — COMPLETE METABOLIC PANEL WITH GFR
ALBUMIN: 4 g/dL (ref 3.6–5.1)
ALK PHOS: 74 U/L (ref 33–130)
ALT: 13 U/L (ref 6–29)
AST: 15 U/L (ref 10–35)
BILIRUBIN TOTAL: 0.5 mg/dL (ref 0.2–1.2)
BUN: 13 mg/dL (ref 7–25)
CO2: 28 mmol/L (ref 20–31)
Calcium: 9.4 mg/dL (ref 8.6–10.4)
Chloride: 95 mmol/L — ABNORMAL LOW (ref 98–110)
Creat: 1.19 mg/dL — ABNORMAL HIGH (ref 0.60–0.93)
GFR, EST AFRICAN AMERICAN: 52 mL/min — AB (ref >=60)
GFR, EST NON AFRICAN AMERICAN: 45 mL/min — AB (ref >=60)
Glucose, Bld: 104 mg/dL — ABNORMAL HIGH (ref 65–99)
Potassium: 4.2 mmol/L (ref 3.5–5.3)
Sodium: 140 mmol/L (ref 135–146)
TOTAL PROTEIN: 7.3 g/dL (ref 6.1–8.1)

## 2015-01-01 LAB — CBC
HCT: 39.3 % (ref 36.0–46.0)
Hemoglobin: 13.1 g/dL (ref 12.0–15.0)
MCH: 31 pg (ref 26.0–34.0)
MCHC: 33.3 g/dL (ref 30.0–36.0)
MCV: 93.1 fL (ref 78.0–100.0)
MPV: 10.3 fL (ref 8.6–12.4)
Platelets: 326 10*3/uL (ref 150–400)
RBC: 4.22 MIL/uL (ref 3.87–5.11)
RDW: 13.6 % (ref 11.5–15.5)
WBC: 8.2 10*3/uL (ref 4.0–10.5)

## 2015-01-01 NOTE — Telephone Encounter (Signed)
Ok to switch to Dr. Georgina Snell.

## 2015-01-01 NOTE — Telephone Encounter (Signed)
Pt made her f/u on BP with Dr.Corey for September and is requesting to switch her pcp to [facility]. Please let me know if this is a problem. Thanks

## 2015-01-01 NOTE — Progress Notes (Signed)
Quick Note:  Normal, no changes. ______ 

## 2015-01-02 NOTE — Telephone Encounter (Signed)
Ok to switch 

## 2015-01-07 ENCOUNTER — Telehealth: Payer: Self-pay | Admitting: Family Medicine

## 2015-01-07 NOTE — Telephone Encounter (Signed)
Advise pt to return to clinic today or tomorrow.

## 2015-01-07 NOTE — Telephone Encounter (Signed)
Pt added onto schedule for tomorrow (01/08/15)

## 2015-01-07 NOTE — Telephone Encounter (Signed)
Pt daughter called after hours clinic regarding mother's BP. States her BP was running about 182/96, Pt was having headaches and felt like there was phlegm in her throat she could not cough up. Called today to check on Pt. Spoke with Pt and her daughter. Daughter reports taking Pt's BP yesterday and it was 117/78. Stressed the importance of taking the BP Rx's daily and if they are still running high it would be a good idea to bring Pt in for a nurse visit before her scheduled follow up with PCP. Verbalized understanding. No further questions.

## 2015-01-08 ENCOUNTER — Encounter: Payer: Self-pay | Admitting: Family Medicine

## 2015-01-08 ENCOUNTER — Ambulatory Visit (INDEPENDENT_AMBULATORY_CARE_PROVIDER_SITE_OTHER): Payer: Medicare Other | Admitting: Family Medicine

## 2015-01-08 VITALS — BP 130/72 | HR 85 | Wt 128.0 lb

## 2015-01-08 DIAGNOSIS — E1121 Type 2 diabetes mellitus with diabetic nephropathy: Secondary | ICD-10-CM | POA: Diagnosis not present

## 2015-01-08 DIAGNOSIS — R29898 Other symptoms and signs involving the musculoskeletal system: Secondary | ICD-10-CM | POA: Diagnosis not present

## 2015-01-08 DIAGNOSIS — E785 Hyperlipidemia, unspecified: Secondary | ICD-10-CM

## 2015-01-08 DIAGNOSIS — I10 Essential (primary) hypertension: Secondary | ICD-10-CM | POA: Diagnosis not present

## 2015-01-08 DIAGNOSIS — I6522 Occlusion and stenosis of left carotid artery: Secondary | ICD-10-CM | POA: Diagnosis not present

## 2015-01-08 DIAGNOSIS — R739 Hyperglycemia, unspecified: Secondary | ICD-10-CM

## 2015-01-08 DIAGNOSIS — E1129 Type 2 diabetes mellitus with other diabetic kidney complication: Secondary | ICD-10-CM

## 2015-01-08 LAB — POCT GLYCOSYLATED HEMOGLOBIN (HGB A1C): Hemoglobin A1C: 6.1

## 2015-01-08 MED ORDER — ATORVASTATIN CALCIUM 40 MG PO TABS: 40.0000 mg | ORAL_TABLET | Freq: Every day | ORAL | Status: DC

## 2015-01-08 NOTE — Assessment & Plan Note (Signed)
Currently reasonably well controlled. No changes. Check labs in a few weeks

## 2015-01-08 NOTE — Assessment & Plan Note (Signed)
Re-start Lipitor 

## 2015-01-08 NOTE — Assessment & Plan Note (Signed)
Currently reasonably well controlled. Continue diet control. Monofilament normal today.

## 2015-01-08 NOTE — Patient Instructions (Signed)
Thank you for coming in today. Start taking a baby aspirin a day.  Take lipitor and your blood pressure medicine.  Return in 1 month for lab recheck.,  Call or go to the emergency room if you get worse, have trouble breathing, have chest pains, or palpitations.   You should be called soon about the ultrasound. Call me if you never hear from them.   Peripheral Vascular Disease Peripheral Vascular Disease (PVD), also called Peripheral Arterial Disease (PAD), is a circulation problem caused by cholesterol (atherosclerotic plaque) deposits in the arteries. PVD commonly occurs in the lower extremities (legs) but it can occur in other areas of the body, such as your arms. The cholesterol buildup in the arteries reduces blood flow which can cause pain and other serious problems. The presence of PVD can place a person at risk for Coronary Artery Disease (CAD).  CAUSES  Causes of PVD can be many. It is usually associated with more than one risk factor such as:   High Cholesterol.  Smoking.  Diabetes.  Lack of exercise or inactivity.  High blood pressure (hypertension).  Obesity.  Family history. SYMPTOMS   When the lower extremities are affected, patients with PVD may experience:  Leg pain with exertion or physical activity. This is called INTERMITTENT CLAUDICATION. This may present as cramping or numbness with physical activity. The location of the pain is associated with the level of blockage. For example, blockage at the abdominal level (distal abdominal aorta) may result in buttock or hip pain. Lower leg arterial blockage may result in calf pain.  As PVD becomes more severe, pain can develop with less physical activity.  In people with severe PVD, leg pain may occur at rest.  Other PVD signs and symptoms:  Leg numbness or weakness.  Coldness in the affected leg or foot, especially when compared to the other leg.  A change in leg color.  Patients with significant PVD are more  prone to ulcers or sores on toes, feet or legs. These may take longer to heal or may reoccur. The ulcers or sores can become infected.  If signs and symptoms of PVD are ignored, gangrene may occur. This can result in the loss of toes or loss of an entire limb.  Not all leg pain is related to PVD. Other medical conditions can cause leg pain such as:  Blood clots (embolism) or Deep Vein Thrombosis.  Inflammation of the blood vessels (vasculitis).  Spinal stenosis. DIAGNOSIS  Diagnosis of PVD can involve several different types of tests. These can include:  Pulse Volume Recording Method (PVR). This test is simple, painless and does not involve the use of X-rays. PVR involves measuring and comparing the blood pressure in the arms and legs. An ABI (Ankle-Brachial Index) is calculated. The normal ratio of blood pressures is 1. As this number becomes smaller, it indicates more severe disease.  < 0.95 - indicates significant narrowing in one or more leg vessels.  <0.8 - there will usually be pain in the foot, leg or buttock with exercise.  <0.4 - will usually have pain in the legs at rest.  <0.25 - usually indicates limb threatening PVD.  Doppler detection of pulses in the legs. This test is painless and checks to see if you have a pulses in your legs/feet.  A dye or contrast material (a substance that highlights the blood vessels so they show up on x-ray) may be given to help your caregiver better see the arteries for the following tests.  The dye is eliminated from your body by the kidney's. Your caregiver may order blood work to check your kidney function and other laboratory values before the following tests are performed:  Magnetic Resonance Angiography (MRA). An MRA is a picture study of the blood vessels and arteries. The MRA machine uses a large magnet to produce images of the blood vessels.  Computed Tomography Angiography (CTA). A CTA is a specialized x-ray that looks at how the blood  flows in your blood vessels. An IV may be inserted into your arm so contrast dye can be injected.  Angiogram. Is a procedure that uses x-rays to look at your blood vessels. This procedure is minimally invasive, meaning a small incision (cut) is made in your groin. A small tube (catheter) is then inserted into the artery of your groin. The catheter is guided to the blood vessel or artery your caregiver wants to examine. Contrast dye is injected into the catheter. X-rays are then taken of the blood vessel or artery. After the images are obtained, the catheter is taken out. TREATMENT  Treatment of PVD involves many interventions which may include:  Lifestyle changes:  Quitting smoking.  Exercise.  Following a low fat, low cholesterol diet.  Control of diabetes.  Foot care is very important to the PVD patient. Good foot care can help prevent infection.  Medication:  Cholesterol-lowering medicine.  Blood pressure medicine.  Anti-platelet drugs.  Certain medicines may reduce symptoms of Intermittent Claudication.  Interventional/Surgical options:  Angioplasty. An Angioplasty is a procedure that inflates a balloon in the blocked artery. This opens the blocked artery to improve blood flow.  Stent Implant. A wire mesh tube (stent) is placed in the artery. The stent expands and stays in place, allowing the artery to remain open.  Peripheral Bypass Surgery. This is a surgical procedure that reroutes the blood around a blocked artery to help improve blood flow. This type of procedure may be performed if Angioplasty or stent implants are not an option. SEEK IMMEDIATE MEDICAL CARE IF:   You develop pain or numbness in your arms or legs.  Your arm or leg turns cold, becomes blue in color.  You develop redness, warmth, swelling and pain in your arms or legs. MAKE SURE YOU:   Understand these instructions.  Will watch your condition.  Will get help right away if you are not doing well  or get worse. Document Released: 06/04/2004 Document Revised: 07/20/2011 Document Reviewed: 05/01/2008 [facility] Patient Information 2015 [place], [place]. This information is not intended to replace advice given to you by your health care provider. Make sure you discuss any questions you have with your health care provider.

## 2015-01-08 NOTE — Assessment & Plan Note (Signed)
Concerning for peripheral arterial disease based on age hypertension smoking and history of carotid arterial disease. ABI pending.

## 2015-01-08 NOTE — Progress Notes (Signed)
Cassidy Jennings is a 74 y.o. female who presents to [facility]  today for discuss blood pressure and leg weakness and pain.  She notes her blood pressure is much better controlled especially at home. Her average blood pressure is between 117 and 148/69-82.  She had one episode of high blood pressure with headache a few days ago but none since. She currently feels well at the chest pains palpitations headache weakness or numbness.  She does however note bilateral leg soreness and pain and weakness. This occurs when she ambulates. She denies any significant symptoms at rest. She notes that she has a history of smoking, hypertension, and carotid artery disease. She has never been evaluated for peripheral arterial disease.  Patient notes that she is not taking Lipitor. She's not quite sure why she stopped taking it. She notes that in the past she had a problem with simvastatin which caused acid reflux.  She cannot recall any intolerance to Lipitor.  Additionally patient notes that she is still smoking however now scabbed back to 3-4 cigarettes per day.   Past Medical History  Diagnosis Date  . Hypertension   . Hyperlipidemia    Past Surgical History  Procedure Laterality Date  . Tubal ligation     Social History  Substance Use Topics  . Smoking status: Current Every Day Smoker -- 0.25 packs/day for 55 years    Types: Cigarettes  . Smokeless tobacco: Not on file  . Alcohol Use: No   family history includes Heart attack (age of onset: 75) in her mother; Hypertension in her mother.  ROS as above Medications: Current Outpatient Prescriptions  Medication Sig Dispense Refill  . AMBULATORY NON FORMULARY MEDICATION Medication Name: Glucometer with lancets and strips. Dx diabetes, not on insulin therapy.  Test once a day. Box of 100. 1 Units 0  . aspirin 81 MG tablet Take 81 mg by mouth daily.    Marland Kitchen losartan-hydrochlorothiazide (HYZAAR) 100-12.5 MG per  tablet take 1 tablet by mouth once daily - OFFICE APPOINTMENT NEEDED FOR FURTHER REFILLS 90 tablet 1  . atorvastatin (LIPITOR) 40 MG tablet Take 1 tablet (40 mg total) by mouth daily. 90 tablet 3   No current facility-administered medications for this visit.   Allergies  Allergen Reactions  . Simvastatin Nausea And Vomiting    gerd gerd     Exam:  BP 130/72 mmHg  Pulse 85  Wt 128 lb (58.06 kg) Gen: Well NAD HEENT: EOMI,  MMM Lungs: Normal work of breathing. CTABL Heart: RRR no MRG Abd: NABS, Soft. Nondistended, Nontender Exts: Brisk capillary refill, warm and well perfused.   Diminished dorsal pedis and posterior tibialis pulses bilaterally Monofilament 4/4 bilaterally.  Results for orders placed or performed in visit on 01/08/15 (from the past 24 hour(s))  POCT HgB A1C     Status: None   Collection Time: 01/08/15 10:53 AM  Result Value Ref Range   Hemoglobin A1C 6.1    No results found.   Please see individual assessment and plan sections.

## 2015-01-10 ENCOUNTER — Other Ambulatory Visit: Payer: Self-pay | Admitting: Family Medicine

## 2015-01-10 ENCOUNTER — Ambulatory Visit (HOSPITAL_COMMUNITY)
Admission: RE | Admit: 2015-01-10 | Discharge: 2015-01-10 | Disposition: A | Discharge status: Home / Self Care | Payer: Medicare Other | Source: Ambulatory Visit | Attending: Family Medicine | Admitting: Family Medicine

## 2015-01-10 ENCOUNTER — Ambulatory Visit (HOSPITAL_BASED_OUTPATIENT_CLINIC_OR_DEPARTMENT_OTHER)
Admission: RE | Admit: 2015-01-10 | Discharge: 2015-01-10 | Disposition: A | Discharge status: Home / Self Care | Payer: Medicare Other | Source: Ambulatory Visit | Attending: Family Medicine | Admitting: Family Medicine

## 2015-01-10 DIAGNOSIS — R531 Weakness: Secondary | ICD-10-CM | POA: Diagnosis not present

## 2015-01-10 DIAGNOSIS — R29898 Other symptoms and signs involving the musculoskeletal system: Secondary | ICD-10-CM

## 2015-01-10 DIAGNOSIS — I10 Essential (primary) hypertension: Secondary | ICD-10-CM | POA: Insufficient documentation

## 2015-01-10 DIAGNOSIS — I771 Stricture of artery: Secondary | ICD-10-CM | POA: Insufficient documentation

## 2015-01-10 DIAGNOSIS — F17200 Nicotine dependence, unspecified, uncomplicated: Secondary | ICD-10-CM | POA: Diagnosis not present

## 2015-01-10 DIAGNOSIS — I70202 Unspecified atherosclerosis of native arteries of extremities, left leg: Secondary | ICD-10-CM | POA: Diagnosis not present

## 2015-01-10 DIAGNOSIS — I739 Peripheral vascular disease, unspecified: Secondary | ICD-10-CM

## 2015-01-10 NOTE — Progress Notes (Signed)
*  PRELIMINARY RESULTS* Vascular Ultrasound Lower Extremity Arterial Duplex has been completed.  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 50-99% stenosis of the mid left femoral artery. Dampened monophasic flow of bilateral distal PTA.    Landry Mellow, RDMS, RVT  01/10/2015, 10:07 AM

## 2015-01-10 NOTE — Progress Notes (Signed)
VASCULAR LAB PRELIMINARY  ARTERIAL  ABI completed: indicative of severe arterial disease bilaterally    RIGHT    LEFT    PRESSURE WAVEFORM  PRESSURE WAVEFORM  BRACHIAL 139 Tri BRACHIAL 129 Tri  DP   DP    AT 60 Mono AT 78 Bi  PT 69 Mono PT 65 Mono  PER   PER    GREAT TOE  NA GREAT TOE  NA    RIGHT LEFT  ABI 0.5 0.56    Landry Mellow, RDMS, RVT  01/10/2015, 9:32 AM

## 2015-01-11 DIAGNOSIS — I739 Peripheral vascular disease, unspecified: Secondary | ICD-10-CM | POA: Insufficient documentation

## 2015-01-11 NOTE — Addendum Note (Signed)
Addended by: Gregor Hams on: 01/11/2015 08:01 AM   Modules accepted: Orders

## 2015-01-29 ENCOUNTER — Other Ambulatory Visit: Payer: Self-pay | Admitting: Family Medicine

## 2015-01-31 ENCOUNTER — Encounter: Payer: Self-pay | Admitting: Family Medicine

## 2015-01-31 ENCOUNTER — Ambulatory Visit (INDEPENDENT_AMBULATORY_CARE_PROVIDER_SITE_OTHER): Payer: Medicare Other | Admitting: Family Medicine

## 2015-01-31 VITALS — BP 145/81 | HR 81 | Wt 131.0 lb

## 2015-01-31 DIAGNOSIS — I6522 Occlusion and stenosis of left carotid artery: Secondary | ICD-10-CM

## 2015-01-31 DIAGNOSIS — Z23 Encounter for immunization: Secondary | ICD-10-CM | POA: Diagnosis not present

## 2015-01-31 DIAGNOSIS — Z0000 Encounter for general adult medical examination without abnormal findings: Secondary | ICD-10-CM

## 2015-01-31 DIAGNOSIS — Z13820 Encounter for screening for osteoporosis: Secondary | ICD-10-CM | POA: Diagnosis not present

## 2015-01-31 DIAGNOSIS — I10 Essential (primary) hypertension: Secondary | ICD-10-CM | POA: Diagnosis not present

## 2015-01-31 DIAGNOSIS — I739 Peripheral vascular disease, unspecified: Secondary | ICD-10-CM

## 2015-01-31 DIAGNOSIS — Z1239 Encounter for other screening for malignant neoplasm of breast: Secondary | ICD-10-CM

## 2015-01-31 DIAGNOSIS — N183 Chronic kidney disease, stage 3 unspecified: Secondary | ICD-10-CM

## 2015-01-31 DIAGNOSIS — E785 Hyperlipidemia, unspecified: Secondary | ICD-10-CM

## 2015-01-31 DIAGNOSIS — E1121 Type 2 diabetes mellitus with diabetic nephropathy: Secondary | ICD-10-CM

## 2015-01-31 DIAGNOSIS — Z720 Tobacco use: Secondary | ICD-10-CM

## 2015-01-31 DIAGNOSIS — E1129 Type 2 diabetes mellitus with other diabetic kidney complication: Secondary | ICD-10-CM

## 2015-01-31 MED ORDER — LOSARTAN POTASSIUM-HCTZ 100-12.5 MG PO TABS: 1.0000 | ORAL_TABLET | Freq: Every day | ORAL | Status: DC

## 2015-01-31 NOTE — Assessment & Plan Note (Signed)
Patient is working on smoking cessation

## 2015-01-31 NOTE — Assessment & Plan Note (Addendum)
Refill losartan and hydrochlorothiazide return in 2 months. Check metabolic panel today

## 2015-01-31 NOTE — Progress Notes (Signed)
Cassidy Jennings is a 74 y.o. female who presents to [facility]  today for follow-up leg pain. Patient was seen about a month ago and diagnosed with leg pain and hypertension. At that time she was thought to have peripheral arterial disease. A duplex arterial ultrasound confirms this. She has an appointment with the vascular surgeon on October 3. She was started on Lipitor 40. She states that she is feeling pretty well without significant leg pain tingling or weakness  Hypertension: Patient has run out of her losartan/hydrochlorothiazide.. She denies any chest pain palpitations or shortness of breath. She feels well otherwise.  Smoking: Patient is down to about 5 cigarettes a day. She is interested in quitting smoking using gum or lozenges. She smokes [employer].  Health maintenance: Patient is due for 13 valent pneumonia vaccine as well as influenza vaccines today. She is overdue for DEXA scan and mammogram.   Past Medical History  Diagnosis Date  . Hypertension   . Hyperlipidemia    Past Surgical History  Procedure Laterality Date  . Tubal ligation     Social History  Substance Use Topics  . Smoking status: Current Every Day Smoker -- 0.25 packs/day for 55 years    Types: Cigarettes  . Smokeless tobacco: Not on file  . Alcohol Use: No   family history includes Heart attack (age of onset: 41) in her mother; Hypertension in her mother.  ROS as above Medications: Current Outpatient Prescriptions  Medication Sig Dispense Refill  . AMBULATORY NON FORMULARY MEDICATION Medication Name: Glucometer with lancets and strips. Dx diabetes, not on insulin therapy.  Test once a day. Box of 100. 1 Units 0  . aspirin 81 MG tablet Take 81 mg by mouth daily.    Marland Kitchen atorvastatin (LIPITOR) 40 MG tablet Take 1 tablet (40 mg total) by mouth daily. 90 tablet 3  . losartan-hydrochlorothiazide (HYZAAR) 100-12.5 MG per tablet take 1 tablet by mouth once daily 30 tablet 0    No current facility-administered medications for this visit.   Allergies  Allergen Reactions  . Simvastatin Nausea And Vomiting    gerd gerd     Exam:  BP 145/81 mmHg  Pulse 81  Wt 131 lb (59.421 kg) Gen: Well NAD HEENT: EOMI,  MMM Lungs: Normal work of breathing. CTABL Heart: RRR no MRG Abd: NABS, Soft. Nondistended, Nontender Exts: Brisk capillary refill, warm and well perfused. Diminished pulses bilateral lower extremities. Intact capillary refill and sensation.  No results found for this or any previous visit (from the past 24 hour(s)). No results found.   Please see individual assessment and plan sections.  Flu vaccine given Pneumonia vaccine given DEXA scan ordered Mammogram ordered   Patient was given a form for advanced directives today and a group discussion with her family was performed.

## 2015-01-31 NOTE — Patient Instructions (Signed)
Thank you for coming in today. Return in 2 month fasting for cholesterol check.  Call or go to the emergency room if you get worse, have trouble breathing, have chest pains, or palpitations.  Work on the advanced directive homework.

## 2015-01-31 NOTE — Assessment & Plan Note (Signed)
On Lipitor. Follow-up with vascular surgery. Check lipids in 2 months

## 2015-01-31 NOTE — Assessment & Plan Note (Signed)
On Lipitor. Check lipids in 2 months

## 2015-02-01 LAB — COMPLETE METABOLIC PANEL WITH GFR
ALBUMIN: 4 g/dL (ref 3.6–5.1)
ALK PHOS: 74 U/L (ref 33–130)
ALT: 20 U/L (ref 6–29)
AST: 20 U/L (ref 10–35)
BILIRUBIN TOTAL: 0.6 mg/dL (ref 0.2–1.2)
BUN: 10 mg/dL (ref 7–25)
CO2: 33 mmol/L — ABNORMAL HIGH (ref 20–31)
CREATININE: 1.23 mg/dL — AB (ref 0.60–0.93)
Calcium: 10 mg/dL (ref 8.6–10.4)
Chloride: 98 mmol/L (ref 98–110)
GFR, EST AFRICAN AMERICAN: 50 mL/min — AB (ref >=60)
GFR, EST NON AFRICAN AMERICAN: 44 mL/min — AB (ref >=60)
GLUCOSE: 98 mg/dL (ref 65–99)
Potassium: 4.3 mmol/L (ref 3.5–5.3)
Sodium: 140 mmol/L (ref 135–146)
TOTAL PROTEIN: 7.2 g/dL (ref 6.1–8.1)

## 2015-02-01 NOTE — Progress Notes (Signed)
Quick Note:  Normal, no changes. ______ 

## 2015-02-07 ENCOUNTER — Encounter: Payer: Self-pay | Admitting: Surgery

## 2015-02-11 ENCOUNTER — Encounter: Payer: Self-pay | Admitting: Surgery

## 2015-02-11 ENCOUNTER — Ambulatory Visit (HOSPITAL_COMMUNITY)
Admission: RE | Admit: 2015-02-11 | Discharge: 2015-02-11 | Disposition: A | Discharge status: Home / Self Care | Payer: Medicare Other | Source: Ambulatory Visit | Attending: Surgery | Admitting: Surgery

## 2015-02-11 ENCOUNTER — Ambulatory Visit (INDEPENDENT_AMBULATORY_CARE_PROVIDER_SITE_OTHER): Payer: Medicare Other | Admitting: Surgery

## 2015-02-11 VITALS — BP 121/67 | HR 74 | Ht 63.25 in | Wt 130.2 lb

## 2015-02-11 DIAGNOSIS — I6522 Occlusion and stenosis of left carotid artery: Secondary | ICD-10-CM | POA: Diagnosis not present

## 2015-02-11 DIAGNOSIS — I6523 Occlusion and stenosis of bilateral carotid arteries: Secondary | ICD-10-CM

## 2015-02-11 DIAGNOSIS — I10 Essential (primary) hypertension: Secondary | ICD-10-CM | POA: Insufficient documentation

## 2015-02-11 DIAGNOSIS — E119 Type 2 diabetes mellitus without complications: Secondary | ICD-10-CM | POA: Insufficient documentation

## 2015-02-11 DIAGNOSIS — E785 Hyperlipidemia, unspecified: Secondary | ICD-10-CM | POA: Diagnosis not present

## 2015-02-11 NOTE — Progress Notes (Signed)
Patient name: Cassidy Jennings MRN: [id number] DOB: February 14, 1941 Sex: female   Referred by: Dr. Ellard Artis  Reason for referral:  Chief Complaint  Patient presents with  . New Evaluation    eval PAD     HISTORY OF PRESENT ILLNESS: This is a 74 year old female who is referred today for evaluation of peripheral vascular disease.  The patient states that she had been having trouble with bilateral leg fatigue and heaviness with walking.  She states that when she started taking her cholesterol medicine that this has improved dramatically to where she does not have significant difficulties.  She has undergone ankle brachial indices evaluation as well as lower extremity duplex.  She basically has ABIs in the 0.5 range bilaterally.  The patient had a carotid Doppler study and March 2016 which revealed greater than 70% left carotid stenosis.  She has not had any follow-up for this.  She is asymptomatic.  Specifically she denies numbness or weakness in either extremity.  She denies slurred speech.  She denies amaurosis fugax.  The patient is medically managed for hypercholesterolemia with a statin.  She suffers from hypertension which is managed with an ARB.  She takes an 81 mg aspirin for antiplatelets therapy.  She continues to smoke 0.25 packs per day.  She is a diabetic.  Her most recent hemoglobin A1c was 6.1.  Past Medical History  Diagnosis Date  . Hypertension   . Hyperlipidemia   . Diabetes mellitus without complication [facility])     Past Surgical History  Procedure Laterality Date  . Tubal ligation      Social History   Social History  . Marital Status: Widowed    Spouse Name: N/A  . Number of Children: 6  . Years of Education: N/A   Occupational History  . Not on file.   Social History Main Topics  . Smoking status: Current Every Day Smoker -- 0.75 packs/day for 63 years    Types: Cigarettes  . Smokeless tobacco: Not on file  . Alcohol Use: No  . Drug Use: No  . Sexual  Activity: Not Currently   Other Topics Concern  . Not on file   Social History Narrative   She smokes but has multiple family members that smoke. Stays active but no regular exercise.     Family History  Problem Relation Age of Onset  . Hypertension Mother   . Heart attack Mother 78  . Heart disease Mother   . Hypertension Sister   . Hypertension Daughter   . Hypertension Son     Allergies as of 02/11/2015 - Review Complete 02/11/2015  Allergen Reaction Noted  . Simvastatin Nausea And Vomiting 02/14/2014    Current Outpatient Prescriptions on File Prior to Visit  Medication Sig Dispense Refill  . AMBULATORY NON FORMULARY MEDICATION Medication Name: Glucometer with lancets and strips. Dx diabetes, not on insulin therapy.  Test once a day. Box of 100. 1 Units 0  . aspirin 81 MG tablet Take 81 mg by mouth daily.    Marland Kitchen atorvastatin (LIPITOR) 40 MG tablet Take 1 tablet (40 mg total) by mouth daily. 90 tablet 3  . losartan-hydrochlorothiazide (HYZAAR) 100-12.5 MG per tablet Take 1 tablet by mouth daily. 90 tablet 1   No current facility-administered medications on file prior to visit.     REVIEW OF SYSTEMS: Please see history of present illness, otherwise all systems negative  PHYSICAL EXAMINATION:  Filed Vitals:   02/11/15 1152  BP: 121/67  Pulse: 74  Height: 5' 3.25" (1.607 m)  Weight: 130 lb 3.2 oz (59.058 kg)  SpO2: 97%   Body mass index is 22.87 kg/(m^2). General: The patient appears their stated age.   HEENT:  No gross abnormalities Pulmonary: Respirations are non-labored Abdomen: Soft and non-tender.  No pulsatile mass  Musculoskeletal: There are no major deformities.   Neurologic: No focal weakness or paresthesias are detected, Skin: There are no ulcer or rashes noted. Psychiatric: The patient has normal affect. Cardiovascular: There is a regular rate and rhythm without significant murmur appreciated.  No carotid bruits.  Pedal pulses aren't  nonpalpable  Diagnostic Studies: I have reviewed her outside carotid Doppler study which shows greater than 70% left carotid stenosis.  She has moderate 50-69 percent right carotid stenosis. Ankle brachial indices were 0.5 bilaterally. Lower extremity duplex shows distal right superficial femoral artery occlusion and high-grade stenosis in the left superficial femoral artery    Assessment:  #1: Lower extremity atherosclerotic disease #2: Carotid occlusive disease Plan: #1: Currently, the patient is asymptomatic, and therefore medical therapy is recommended.  The patient is on maximal medical therapy including a statin for cholesterol medication an ARB for hypertension and an aspirin for antiplatelets therapy.  I discussed the benefits of smoking cessation.  We also discussed the benefit of an exercise program and her claudication distance.  I told her that if she developed a wound on her foot to contact me immediately.  If her symptoms get worse before I see her again she will contact me.  #2: The patient has asymptomatic high-grade left carotid stenosis.  I am going to have this evaluated again today as it has been 6 months since she has had her initial ultrasound.  The patient will follow-up with me in 6 months for evaluation and follow-up of her carotid disease, assuming that it is less than 80% today.  She will also have follow-up in one year for her lower extremity claudication.     Eldridge Abrahams, M.D. [facility] Office: [phone] Pager:  [phone]

## 2015-02-11 NOTE — Addendum Note (Signed)
Addended by: Dorthula Rue L on: 02/11/2015 02:50 PM   Modules accepted: Orders

## 2015-02-27 ENCOUNTER — Other Ambulatory Visit: Payer: Self-pay | Admitting: Family Medicine

## 2015-04-02 ENCOUNTER — Ambulatory Visit: Payer: Medicare Other | Admitting: Family Medicine

## 2015-08-12 ENCOUNTER — Ambulatory Visit: Payer: Medicare Other | Admitting: Surgery

## 2015-08-12 ENCOUNTER — Encounter (HOSPITAL_COMMUNITY): Payer: Medicare Other

## 2015-08-19 ENCOUNTER — Other Ambulatory Visit: Payer: Self-pay | Admitting: Family Medicine

## 2015-08-27 ENCOUNTER — Telehealth: Payer: Self-pay | Admitting: Family Medicine

## 2015-08-27 NOTE — Telephone Encounter (Signed)
I called pt and scheduled her for 09-02-15 with Dr.Corey for 30 min F/u on BP and Diabetes

## 2015-09-02 ENCOUNTER — Ambulatory Visit (INDEPENDENT_AMBULATORY_CARE_PROVIDER_SITE_OTHER): Payer: Medicare Other | Admitting: Family Medicine

## 2015-09-02 ENCOUNTER — Encounter: Payer: Self-pay | Admitting: Family Medicine

## 2015-09-02 ENCOUNTER — Telehealth: Payer: Self-pay | Admitting: Surgery

## 2015-09-02 VITALS — BP 118/77 | HR 85 | Wt 125.0 lb

## 2015-09-02 DIAGNOSIS — I779 Disorder of arteries and arterioles, unspecified: Secondary | ICD-10-CM

## 2015-09-02 DIAGNOSIS — E1122 Type 2 diabetes mellitus with diabetic chronic kidney disease: Secondary | ICD-10-CM

## 2015-09-02 DIAGNOSIS — N183 Chronic kidney disease, stage 3 unspecified: Secondary | ICD-10-CM

## 2015-09-02 DIAGNOSIS — E785 Hyperlipidemia, unspecified: Secondary | ICD-10-CM

## 2015-09-02 DIAGNOSIS — I739 Peripheral vascular disease, unspecified: Secondary | ICD-10-CM

## 2015-09-02 DIAGNOSIS — I10 Essential (primary) hypertension: Secondary | ICD-10-CM | POA: Diagnosis not present

## 2015-09-02 DIAGNOSIS — E1129 Type 2 diabetes mellitus with other diabetic kidney complication: Secondary | ICD-10-CM | POA: Diagnosis not present

## 2015-09-02 LAB — COMPREHENSIVE METABOLIC PANEL
ALT: 23 U/L (ref 6–29)
AST: 23 U/L (ref 10–35)
Albumin: 3.9 g/dL (ref 3.6–5.1)
Alkaline Phosphatase: 51 U/L (ref 33–130)
BILIRUBIN TOTAL: 0.8 mg/dL (ref 0.2–1.2)
BUN: 18 mg/dL (ref 7–25)
CHLORIDE: 101 mmol/L (ref 98–110)
CO2: 31 mmol/L (ref 20–31)
CREATININE: 1.38 mg/dL — AB (ref 0.60–0.93)
Calcium: 9.1 mg/dL (ref 8.6–10.4)
Glucose, Bld: 114 mg/dL — ABNORMAL HIGH (ref 65–99)
Potassium: 3.9 mmol/L (ref 3.5–5.3)
SODIUM: 142 mmol/L (ref 135–146)
TOTAL PROTEIN: 6.8 g/dL (ref 6.1–8.1)

## 2015-09-02 LAB — LIPID PANEL
CHOL/HDL RATIO: 3.6 ratio (ref <=5.0)
Cholesterol: 128 mg/dL (ref 125–200)
HDL: 36 mg/dL — AB (ref >=46)
LDL CALC: 63 mg/dL (ref <130)
TRIGLYCERIDES: 144 mg/dL (ref <150)
VLDL: 29 mg/dL (ref <30)

## 2015-09-02 LAB — CBC
HCT: 38.4 % (ref 35.0–45.0)
HEMOGLOBIN: 12.9 g/dL (ref 11.7–15.5)
MCH: 31.7 pg (ref 27.0–33.0)
MCHC: 33.6 g/dL (ref 32.0–36.0)
MCV: 94.3 fL (ref 80.0–100.0)
MPV: 10.5 fL (ref 7.5–12.5)
PLATELETS: 254 10*3/uL (ref 140–400)
RBC: 4.07 MIL/uL (ref 3.80–5.10)
RDW: 13.6 % (ref 11.0–15.0)
WBC: 5.6 10*3/uL (ref 3.8–10.8)

## 2015-09-02 LAB — POCT GLYCOSYLATED HEMOGLOBIN (HGB A1C): HEMOGLOBIN A1C: 6

## 2015-09-02 MED ORDER — ATORVASTATIN CALCIUM 40 MG PO TABS: 40.0000 mg | ORAL_TABLET | Freq: Every day | ORAL | Status: DC

## 2015-09-02 MED ORDER — LOSARTAN POTASSIUM-HCTZ 100-12.5 MG PO TABS: 1.0000 | ORAL_TABLET | Freq: Every day | ORAL | Status: DC

## 2015-09-02 NOTE — Assessment & Plan Note (Signed)
No claudication symptoms doing well. Work on smoking reduction/cessation. Continue blood pressure elevated and antiplatelet therapy management

## 2015-09-02 NOTE — Patient Instructions (Signed)
Thank you for coming in today. Get labs and schedule mammogram and bone density test.  Try to reduce smoking.  Return in 6 months or sooner if needed.  Call or go to the emergency room if you get worse, have trouble breathing, have chest pains, or palpitations.

## 2015-09-02 NOTE — Assessment & Plan Note (Signed)
Check lipids 

## 2015-09-02 NOTE — Assessment & Plan Note (Signed)
Reduce smoking. Recheck with vascular surgery

## 2015-09-02 NOTE — Telephone Encounter (Signed)
Dr. Georgina Snell called to say pt needs to see Dr. Trula Slade in six months after surgery. Per him six months is now, the month of April. Dr. Georgina Snell was informed the pt has an appointment in October, but the pt is will need to see the doctor sooner because of the notes saying a six month follow up. Please call pt to schedule appointment. Thanks

## 2015-09-02 NOTE — Assessment & Plan Note (Signed)
A1c today in the prediabetic range. Continue diet modification. Hopefully patient will get a diabetic eye exam soon.

## 2015-09-02 NOTE — Assessment & Plan Note (Signed)
Doing well. Check CMP. Refill losartan/hydrochlorothiazide. Recheck in 6 months.

## 2015-09-02 NOTE — Progress Notes (Signed)
       Cassidy Jennings is a 75 y.o. female who presents to [facility] today for follow-up hypertension and hyperlipidemia and diabetes.  1) hypertension: Doing well with combination of losartan and hydrochlorothiazide. No chest pains palpitations or lightheadedness. She feels well.  2) hyperlipidemia: Doing well with Lipitor. No significant muscle aches.  3) diabetes: Diet controlled. Patient feels well. She has not yet gotten a diabetic eye exam. She does not have the money for just yet.  4) peripheral vascular disease and coronary artery disease: Doing reasonably well per vascular surgery management. She should follow-up her most recent vascular surgery note. She does not think that she can quit smoking but is interested in cutting down from half a pack a day to a quarter of a pack a day.  5) chronic kidney disease: Stage III last check. Doing well.   Past Medical History  Diagnosis Date  . Hypertension   . Hyperlipidemia   . Diabetes mellitus without complication [facility])    Past Surgical History  Procedure Laterality Date  . Tubal ligation     Social History  Substance Use Topics  . Smoking status: Current Every Day Smoker -- 0.75 packs/day for 63 years    Types: Cigarettes  . Smokeless tobacco: Not on file  . Alcohol Use: No   family history includes Heart attack (age of onset: 36) in her mother; Heart disease in her mother; Hypertension in her daughter, mother, sister, and son.  ROS as above Medications: Current Outpatient Prescriptions  Medication Sig Dispense Refill  . AMBULATORY NON FORMULARY MEDICATION Medication Name: Glucometer with lancets and strips. Dx diabetes, not on insulin therapy.  Test once a day. Box of 100. 1 Units 0  . aspirin 81 MG tablet Take 81 mg by mouth daily.    Marland Kitchen atorvastatin (LIPITOR) 40 MG tablet Take 1 tablet (40 mg total) by mouth daily. 90  tablet 3  . losartan-hydrochlorothiazide (HYZAAR) 100-12.5 MG tablet Take 1 tablet by mouth daily. 90 tablet 1  . MULTIPLE VITAMIN PO Take by mouth.     No current facility-administered medications for this visit.   Allergies  Allergen Reactions  . Simvastatin Nausea And Vomiting    gerd gerd     Exam:  BP 118/77 mmHg  Pulse 85  Wt 125 lb (56.7 kg)  SpO2 98%  Body mass index is 21.96 kg/(m^2). Gen: Well NAD HEENT: EOMI,  MMM Lungs: Normal work of breathing. CTABL Heart: RRR no MRG Abd: NABS, Soft. Nondistended, Nontender Exts: Brisk capillary refill, warm and well perfused.   Results for orders placed or performed in visit on 09/02/15 (from the past 24 hour(s))  POCT HgB A1C     Status: None   Collection Time: 09/02/15 10:27 AM  Result Value Ref Range   Hemoglobin A1C 6.0    No results found.   Please see individual assessment and plan sections.

## 2015-09-02 NOTE — Assessment & Plan Note (Signed)
Check metabolic panel.  

## 2015-09-03 LAB — VITAMIN D 25 HYDROXY (VIT D DEFICIENCY, FRACTURES): Vit D, 25-Hydroxy: 50 ng/mL (ref 30–100)

## 2015-09-03 NOTE — Progress Notes (Signed)
Quick Note:  Labs look great! Keep taking the medicine you are taking. ______

## 2015-09-18 ENCOUNTER — Ambulatory Visit: Payer: Medicare Other

## 2015-09-18 ENCOUNTER — Other Ambulatory Visit: Payer: Medicare Other

## 2015-10-16 ENCOUNTER — Ambulatory Visit: Payer: Medicare Other

## 2015-10-16 ENCOUNTER — Other Ambulatory Visit: Payer: Medicare Other

## 2015-10-24 ENCOUNTER — Other Ambulatory Visit: Payer: Self-pay | Admitting: *Deleted

## 2015-10-24 DIAGNOSIS — I6523 Occlusion and stenosis of bilateral carotid arteries: Secondary | ICD-10-CM

## 2015-10-28 ENCOUNTER — Encounter: Payer: Self-pay | Admitting: Surgery

## 2015-11-04 ENCOUNTER — Ambulatory Visit (HOSPITAL_COMMUNITY): Payer: Medicare Other | Attending: Surgery

## 2015-11-04 ENCOUNTER — Ambulatory Visit: Payer: Medicare Other | Admitting: Surgery

## 2015-11-20 ENCOUNTER — Ambulatory Visit: Payer: Medicare Other

## 2015-11-20 ENCOUNTER — Other Ambulatory Visit: Payer: Medicare Other

## 2015-12-12 ENCOUNTER — Encounter: Payer: Self-pay | Admitting: Surgery

## 2015-12-13 ENCOUNTER — Ambulatory Visit (HOSPITAL_COMMUNITY): Payer: Medicare Other

## 2015-12-16 ENCOUNTER — Ambulatory Visit: Payer: Medicare Other | Admitting: Surgery

## 2016-01-10 ENCOUNTER — Other Ambulatory Visit: Payer: Self-pay

## 2016-01-23 ENCOUNTER — Encounter: Payer: Self-pay | Admitting: Surgery

## 2016-01-27 ENCOUNTER — Ambulatory Visit (HOSPITAL_COMMUNITY): Payer: Medicare Other | Attending: Surgery

## 2016-01-27 ENCOUNTER — Ambulatory Visit (HOSPITAL_COMMUNITY): Payer: Medicare Other

## 2016-01-27 ENCOUNTER — Ambulatory Visit: Payer: Medicare Other | Admitting: Surgery

## 2016-02-17 ENCOUNTER — Ambulatory Visit: Payer: Medicare Other | Admitting: Surgery

## 2016-02-17 ENCOUNTER — Encounter (HOSPITAL_COMMUNITY): Payer: Medicare Other

## 2016-03-14 ENCOUNTER — Other Ambulatory Visit: Payer: Self-pay | Admitting: Family Medicine

## 2016-04-08 ENCOUNTER — Encounter: Payer: Self-pay | Admitting: Surgery

## 2016-04-09 ENCOUNTER — Encounter: Payer: Self-pay | Admitting: Family Medicine

## 2016-04-13 ENCOUNTER — Ambulatory Visit (HOSPITAL_COMMUNITY): Payer: Medicare Other

## 2016-04-13 ENCOUNTER — Ambulatory Visit: Payer: Medicare Other | Admitting: Surgery

## 2016-04-13 ENCOUNTER — Ambulatory Visit (HOSPITAL_COMMUNITY): Payer: Medicare Other | Attending: Surgery

## 2016-04-20 DIAGNOSIS — Z23 Encounter for immunization: Secondary | ICD-10-CM | POA: Diagnosis not present

## 2016-05-02 DIAGNOSIS — S40862A Insect bite (nonvenomous) of left upper arm, initial encounter: Secondary | ICD-10-CM | POA: Diagnosis not present

## 2016-05-02 DIAGNOSIS — Z888 Allergy status to other drugs, medicaments and biological substances status: Secondary | ICD-10-CM | POA: Diagnosis not present

## 2016-05-02 DIAGNOSIS — B889 Infestation, unspecified: Secondary | ICD-10-CM | POA: Diagnosis not present

## 2016-05-02 DIAGNOSIS — F17210 Nicotine dependence, cigarettes, uncomplicated: Secondary | ICD-10-CM | POA: Diagnosis not present

## 2016-05-02 DIAGNOSIS — L299 Pruritus, unspecified: Secondary | ICD-10-CM | POA: Diagnosis not present

## 2016-05-02 DIAGNOSIS — B888 Other specified infestations: Secondary | ICD-10-CM | POA: Diagnosis not present

## 2016-05-02 DIAGNOSIS — Z7982 Long term (current) use of aspirin: Secondary | ICD-10-CM | POA: Diagnosis not present

## 2016-05-02 DIAGNOSIS — S20462A Insect bite (nonvenomous) of left back wall of thorax, initial encounter: Secondary | ICD-10-CM | POA: Diagnosis not present

## 2016-05-02 DIAGNOSIS — S20461A Insect bite (nonvenomous) of right back wall of thorax, initial encounter: Secondary | ICD-10-CM | POA: Diagnosis not present

## 2016-05-02 DIAGNOSIS — S40861A Insect bite (nonvenomous) of right upper arm, initial encounter: Secondary | ICD-10-CM | POA: Diagnosis not present

## 2016-05-02 DIAGNOSIS — Z79899 Other long term (current) drug therapy: Secondary | ICD-10-CM | POA: Diagnosis not present

## 2016-05-05 ENCOUNTER — Other Ambulatory Visit: Payer: Self-pay | Admitting: Family Medicine

## 2016-05-15 ENCOUNTER — Encounter: Payer: Medicare Other | Admitting: Family Medicine

## 2016-06-04 ENCOUNTER — Other Ambulatory Visit: Payer: Self-pay | Admitting: Family Medicine

## 2016-06-08 ENCOUNTER — Encounter: Payer: Self-pay | Admitting: Surgery

## 2016-06-15 ENCOUNTER — Ambulatory Visit: Payer: Medicare Other | Admitting: Surgery

## 2016-06-15 ENCOUNTER — Ambulatory Visit (HOSPITAL_COMMUNITY): Payer: Medicare Other

## 2016-06-15 ENCOUNTER — Ambulatory Visit (HOSPITAL_COMMUNITY): Payer: Medicare Other | Attending: Surgery

## 2016-06-17 DIAGNOSIS — Z888 Allergy status to other drugs, medicaments and biological substances status: Secondary | ICD-10-CM | POA: Diagnosis not present

## 2016-06-17 DIAGNOSIS — J069 Acute upper respiratory infection, unspecified: Secondary | ICD-10-CM | POA: Diagnosis not present

## 2016-06-17 DIAGNOSIS — R070 Pain in throat: Secondary | ICD-10-CM | POA: Diagnosis not present

## 2016-06-17 DIAGNOSIS — F17210 Nicotine dependence, cigarettes, uncomplicated: Secondary | ICD-10-CM | POA: Diagnosis not present

## 2016-06-17 DIAGNOSIS — J3489 Other specified disorders of nose and nasal sinuses: Secondary | ICD-10-CM | POA: Diagnosis not present

## 2016-06-17 DIAGNOSIS — Z7982 Long term (current) use of aspirin: Secondary | ICD-10-CM | POA: Diagnosis not present

## 2016-06-17 DIAGNOSIS — R05 Cough: Secondary | ICD-10-CM | POA: Diagnosis not present

## 2016-06-17 DIAGNOSIS — Z79899 Other long term (current) drug therapy: Secondary | ICD-10-CM | POA: Diagnosis not present

## 2016-06-17 DIAGNOSIS — R0981 Nasal congestion: Secondary | ICD-10-CM | POA: Diagnosis not present

## 2016-06-18 DIAGNOSIS — R05 Cough: Secondary | ICD-10-CM | POA: Diagnosis not present

## 2016-06-22 ENCOUNTER — Other Ambulatory Visit: Payer: Self-pay | Admitting: Family Medicine

## 2016-06-22 DIAGNOSIS — E785 Hyperlipidemia, unspecified: Secondary | ICD-10-CM

## 2016-07-04 ENCOUNTER — Other Ambulatory Visit: Payer: Self-pay | Admitting: Family Medicine

## 2016-07-20 ENCOUNTER — Ambulatory Visit: Payer: Medicare Other

## 2016-07-22 ENCOUNTER — Encounter: Payer: Medicare Other | Admitting: Family Medicine

## 2016-08-05 ENCOUNTER — Ambulatory Visit (INDEPENDENT_AMBULATORY_CARE_PROVIDER_SITE_OTHER): Payer: Medicare Other | Admitting: Family Medicine

## 2016-08-05 ENCOUNTER — Encounter: Payer: Self-pay | Admitting: Family Medicine

## 2016-08-05 VITALS — BP 133/68 | HR 85 | Wt 125.0 lb

## 2016-08-05 DIAGNOSIS — R7303 Prediabetes: Secondary | ICD-10-CM | POA: Diagnosis not present

## 2016-08-05 DIAGNOSIS — Z1231 Encounter for screening mammogram for malignant neoplasm of breast: Secondary | ICD-10-CM

## 2016-08-05 DIAGNOSIS — Z780 Asymptomatic menopausal state: Secondary | ICD-10-CM

## 2016-08-05 DIAGNOSIS — H918X3 Other specified hearing loss, bilateral: Secondary | ICD-10-CM | POA: Diagnosis not present

## 2016-08-05 DIAGNOSIS — I739 Peripheral vascular disease, unspecified: Secondary | ICD-10-CM | POA: Diagnosis not present

## 2016-08-05 DIAGNOSIS — Z1239 Encounter for other screening for malignant neoplasm of breast: Secondary | ICD-10-CM

## 2016-08-05 DIAGNOSIS — Z0000 Encounter for general adult medical examination without abnormal findings: Secondary | ICD-10-CM | POA: Diagnosis not present

## 2016-08-05 DIAGNOSIS — I779 Disorder of arteries and arterioles, unspecified: Secondary | ICD-10-CM

## 2016-08-05 DIAGNOSIS — N183 Chronic kidney disease, stage 3 unspecified: Secondary | ICD-10-CM

## 2016-08-05 DIAGNOSIS — E782 Mixed hyperlipidemia: Secondary | ICD-10-CM

## 2016-08-05 DIAGNOSIS — I10 Essential (primary) hypertension: Secondary | ICD-10-CM | POA: Diagnosis not present

## 2016-08-05 DIAGNOSIS — Z720 Tobacco use: Secondary | ICD-10-CM

## 2016-08-05 LAB — COMPLETE METABOLIC PANEL WITH GFR
ALBUMIN: 3.8 g/dL (ref 3.6–5.1)
ALK PHOS: 72 U/L (ref 33–130)
ALT: 18 U/L (ref 6–29)
AST: 20 U/L (ref 10–35)
BUN: 18 mg/dL (ref 7–25)
CALCIUM: 10.3 mg/dL (ref 8.6–10.4)
CHLORIDE: 97 mmol/L — AB (ref 98–110)
CO2: 35 mmol/L — ABNORMAL HIGH (ref 20–31)
CREATININE: 1.53 mg/dL — AB (ref 0.60–0.93)
GFR, EST AFRICAN AMERICAN: 38 mL/min — AB (ref >=60)
GFR, Est Non African American: 33 mL/min — ABNORMAL LOW (ref >=60)
Glucose, Bld: 104 mg/dL — ABNORMAL HIGH (ref 65–99)
Potassium: 4 mmol/L (ref 3.5–5.3)
Sodium: 139 mmol/L (ref 135–146)
Total Bilirubin: 0.8 mg/dL (ref 0.2–1.2)
Total Protein: 7.1 g/dL (ref 6.1–8.1)

## 2016-08-05 LAB — LIPID PANEL W/REFLEX DIRECT LDL
Cholesterol: 136 mg/dL (ref <200)
HDL: 33 mg/dL — ABNORMAL LOW (ref >50)
LDL-CHOLESTEROL: 76 mg/dL
Non-HDL Cholesterol (Calc): 103 mg/dL (ref <130)
TRIGLYCERIDES: 174 mg/dL — AB (ref <150)
Total CHOL/HDL Ratio: 4.1 Ratio (ref <5.0)

## 2016-08-05 LAB — CBC
HEMATOCRIT: 40.3 % (ref 35.0–45.0)
HEMOGLOBIN: 13.6 g/dL (ref 11.7–15.5)
MCH: 31.8 pg (ref 27.0–33.0)
MCHC: 33.7 g/dL (ref 32.0–36.0)
MCV: 94.2 fL (ref 80.0–100.0)
MPV: 10.2 fL (ref 7.5–12.5)
Platelets: 230 10*3/uL (ref 140–400)
RBC: 4.28 MIL/uL (ref 3.80–5.10)
RDW: 14.1 % (ref 11.0–15.0)
WBC: 7.1 10*3/uL (ref 3.8–10.8)

## 2016-08-05 MED ORDER — LOSARTAN POTASSIUM-HCTZ 100-12.5 MG PO TABS: 1.0000 | ORAL_TABLET | Freq: Every day | ORAL | 0 refills | Status: DC

## 2016-08-05 MED ORDER — ATORVASTATIN CALCIUM 40 MG PO TABS: 40.0000 mg | ORAL_TABLET | Freq: Every day | ORAL | 0 refills | Status: DC

## 2016-08-05 NOTE — Patient Instructions (Addendum)
Thank you for coming in today. Get labs today.  Get mammogram and bone density scan.  Work on cutting back on smoking.  Recheck in 6 months or sooner if needed.   You should hear from audiology soon about hearing loss.

## 2016-08-05 NOTE — Progress Notes (Signed)
Subjective:   Cassidy Jennings is a 76 y.o. female who presents for Medicare Annual (Subsequent) preventive examination.  Active Problems: HTN: Managed with medications listed below. Doing well.   HLD: Taking lipitor without problems   PAD: Not ready to quit smoking. Annual surveillance.   Screening: Mammogram and Dexa pending.  Screening labs pending.   Decreased hearing: Ongoing. Wants referral to audiology.   Review of Systems:  No headache, visual changes, nausea, vomiting, diarrhea, constipation, dizziness, abdominal pain, skin rash, fevers, chills, night sweats, weight loss, swollen lymph nodes, body aches, joint swelling, muscle aches, chest pain, shortness of breath, mood changes, visual or auditory hallucinations.         Objective:     Vitals: BP 133/68   Pulse 85   Wt 125 lb (56.7 kg)   BMI 21.97 kg/m   Body mass index is 21.97 kg/m.              Exam:  BP 133/68   Pulse 85   Wt 125 lb (56.7 kg)   BMI 21.97 kg/m  Gen: Well NAD HEENT: EOMI,  MMM Lungs: Normal work of breathing. CTABL Heart: RRR no MRG Abd: NABS, Soft. Nondistended, Nontender Exts: Brisk capillary refill, warm and well perfused. Intact pulses.     Tobacco History  Smoking Status  . Current Every Day Smoker  . Packs/day: 0.75  . Years: 63.00  . Types: Cigarettes  Smokeless Tobacco  . Not on file     Not ready to quit.   Past Medical History:  Diagnosis Date  . Diabetes mellitus without complication ([place])   . Hyperlipidemia   . Hypertension    Past Surgical History:  Procedure Laterality Date  . TUBAL LIGATION     Family History  Problem Relation Age of Onset  . Hypertension Mother   . Heart attack Mother 66  . Heart disease Mother   . Hypertension Sister   . Hypertension Daughter   . Hypertension Son    History  Sexual Activity  . Sexual activity: Not Currently    Outpatient Encounter Prescriptions as of 08/05/2016  Medication Sig  . AMBULATORY NON  FORMULARY MEDICATION Medication Name: Glucometer with lancets and strips. Dx diabetes, not on insulin therapy.  Test once a day. Box of 100.  Marland Kitchen aspirin 81 MG tablet Take 81 mg by mouth daily.  Marland Kitchen atorvastatin (LIPITOR) 40 MG tablet Take 1 tablet (40 mg total) by mouth daily.  Marland Kitchen losartan-hydrochlorothiazide (HYZAAR) 100-12.5 MG tablet Take 1 tablet by mouth daily.  . MULTIPLE VITAMIN PO Take by mouth.  . [DISCONTINUED] atorvastatin (LIPITOR) 40 MG tablet Take 1 tablet (40 mg total) by mouth daily. NEED FOLLOW UP VISIT FOR MORE REFILLS  . [DISCONTINUED] losartan-hydrochlorothiazide (HYZAAR) 100-12.5 MG tablet take 1 tablet by mouth once daily   No facility-administered encounter medications on file as of 08/05/2016.     Activities of Daily Living Normal. See scanned document.   Patient Care Team: Gregor Hams, MD as PCP - General (Family Medicine)    Assessment:     Exercise Activities and Dietary recommendations    Goals    None     Fall Risk Fall Risk  08/05/2016 01/10/2016  Falls in the past year? No No   Depression Screen PHQ 2/9 Scores 08/05/2016  PHQ - 2 Score 0     Cognitive Function        Immunization History  Administered Date(s) Administered  . Influenza Split 03/22/2011, 03/31/2012  .  Influenza Whole 03/19/2009, 04/17/2010  . Influenza,inj,Quad PF,36+ Mos 01/31/2015  . Influenza-Unspecified 05/12/2015  . Pneumococcal Conjugate-13 01/31/2015  . Pneumococcal Polysaccharide-23 04/21/2011  . Tdap 04/21/2011  . Zoster 04/10/2012   Screening Tests Health Maintenance  Topic Date Due  . DEXA SCAN  05/28/2006  . INFLUENZA VACCINE  12/10/2015  . TETANUS/TDAP  04/20/2021  . COLONOSCOPY  07/04/2022  . PNA vac Low Risk Adult  Completed      Plan:    HTN: Continue meds. Recheck labs  TWS:[id number] lipitor check lipids and CMP.   PAD: Not ready to quit smoking. Annual surveillance.   Screening: Mammogram and Dexa pending.  Screening labs pending.    Decreased hearing: Refer audiology   During the course of the visit the patient was educated and counseled about the following appropriate screening and preventive services:   Vaccines to include Pneumoccal, Influenza, Hepatitis B, Td, Zostavax, HCV  Electrocardiogram  Cardiovascular Disease  Colorectal cancer screening  Bone density screening  Diabetes screening  Glaucoma screening  Mammography/PAP  Nutrition counseling   Patient Instructions (the written plan) was given to the patient.   Lynne Leader, MD  08/05/2016    Orders Placed This Encounter  Procedures  . MM DIGITAL SCREENING BILATERAL    Standing Status:   Future    Standing Expiration Date:   10/05/2017    Order Specific Question:   Preferred imaging location?    Answer:   [facility]    Order Specific Question:   Reason for exam:    Answer:   screan breast cancer  . DG Bone Density    Standing Status:   Future    Standing Expiration Date:   10/06/2017    Order Specific Question:   Reason for exam:    Answer:   screen bone density    Order Specific Question:   Preferred imaging location?    Answer:   [facility]  . CBC  . COMPLETE METABOLIC PANEL WITH GFR  . Lipid Panel w/reflex Direct LDL  . TSH  . VITAMIN D 25 Hydroxy (Vit-D Deficiency, Fractures)  . Hemoglobin A1c  . Ambulatory referral to Audiology    Referral Priority:   Routine    Referral Type:   Audiology Exam    Referral Reason:   Specialty Services Required    Requested Specialty:   Audiology    Number of Visits Requested:   1

## 2016-08-06 LAB — HEMOGLOBIN A1C
Hgb A1c MFr Bld: 5.7 % — ABNORMAL HIGH (ref <5.7)
Mean Plasma Glucose: 117 mg/dL

## 2016-08-06 LAB — TSH: TSH: 2.4 m[IU]/L

## 2016-08-06 LAB — VITAMIN D 25 HYDROXY (VIT D DEFICIENCY, FRACTURES): Vit D, 25-Hydroxy: 60 ng/mL (ref 30–100)

## 2016-08-06 NOTE — Addendum Note (Signed)
Addended by: Gregor Hams on: 08/06/2016 07:30 AM   Modules accepted: Orders

## 2016-08-19 ENCOUNTER — Encounter: Payer: Self-pay | Admitting: Family Medicine

## 2016-08-19 ENCOUNTER — Ambulatory Visit (INDEPENDENT_AMBULATORY_CARE_PROVIDER_SITE_OTHER): Payer: Medicare Other | Admitting: Family Medicine

## 2016-08-19 VITALS — BP 139/62 | HR 88 | Wt 126.0 lb

## 2016-08-19 DIAGNOSIS — N183 Chronic kidney disease, stage 3 unspecified: Secondary | ICD-10-CM

## 2016-08-19 DIAGNOSIS — I739 Peripheral vascular disease, unspecified: Secondary | ICD-10-CM | POA: Diagnosis not present

## 2016-08-19 DIAGNOSIS — I10 Essential (primary) hypertension: Secondary | ICD-10-CM

## 2016-08-19 NOTE — Patient Instructions (Addendum)
Thank you for coming in today. Get labs today.  Talk to [facility] about scheduling Ultrasound of results.  We will send results to the kidney doctor.  [facility] at P#[phone],F) [phone]  Work on cutting back on smoking,.  Recheck in 2 months.   Fill out the advanced directive please.    Chronic Kidney Disease, Adult Chronic kidney disease (CKD) happens when the kidneys are damaged during a time of 3 or more months. The kidneys are two organs that do many important jobs in the body. These jobs include:  Removing wastes and extra fluids from the blood.  Making hormones that maintain the amount of fluid in your tissues and blood vessels.  Making sure that the body has the right amount of fluids and chemicals. Most of the time, this condition does not go away, but it can usually be controlled. Steps must be taken to slow down the kidney damage or stop it from getting worse. Otherwise, the kidneys may stop working. Follow these instructions at home:  Follow your diet as told by your doctor. You may need to avoid alcohol, salty foods (sodium), and foods that are high in potassium, calcium, and protein.  Take over-the-counter and prescription medicines only as told by your doctor. Do not take any new medicines unless your doctor says you can do that. These include vitamins and minerals.  Medicines and nutritional supplements can make kidney damage worse.  Your doctor may need to change how much medicine you take.  Do not use any tobacco products. These include cigarettes, chewing tobacco, and e-cigarettes. If you need help quitting, ask your doctor.  Keep all follow-up visits as told by your doctor. This is important.  Check your blood pressure. Tell your doctor if there are changes to your blood pressure.  Get to a healthy weight. Stay at that weight. If you need help with this, ask your doctor.  Start or continue an exercise plan. Try to exercise at least 30 minutes  a day, 5 days a week.  Stay up-to-date with your shots (immunizations) as told by your doctor. Contact a doctor if:  Your symptoms get worse.  You have new symptoms. Get help right away if:  You have symptoms of end-stage kidney disease. These include:  Headaches.  Skin that is darker or lighter than normal.  Numbness in your hands or feet.  Easy bruising.  Having hiccups often.  Chest pain.  Shortness of breath.  Stopping of menstrual periods in women.  You have a fever.  You are making very little pee (urine).  You have pain or bleeding when you pee (urinate). This information is not intended to replace advice given to you by your health care provider. Make sure you discuss any questions you have with your health care provider. Document Released: 07/22/2009 Document Revised: 10/03/2015 Document Reviewed: 12/25/2011 Elsevier Interactive Patient Education  2017 [employer].

## 2016-08-19 NOTE — Progress Notes (Signed)
Cassidy Jennings is a 76 y.o. female who presents to [place] today for worsening chronic kidney disease. Patient has had ongoing mild chronic kidney disease stage III for years. She had recent labs about 2 weeks ago which showed GFR decreasing from about 50 to 38. The patient was contacted and informed of this and a referral was placed for nephrology. The patient and her family were unaware that she had chronic kidney disease and her very concerned. They are today for further evaluation. She denies fevers or chills urinary frequency urgency or dysuria. 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 She continues to smoke less than a pack of cigarettes per day.   Past Medical History:  Diagnosis Date  . Chronic kidney disease   . Hyperlipidemia   . Hypertension   . Prediabetes    Past Surgical History:  Procedure Laterality Date  . TUBAL LIGATION     Social History  Substance Use Topics  . Smoking status: Current Every Day Smoker    Packs/day: 0.75    Years: 63.00    Types: Cigarettes  . Smokeless tobacco: Not on file  . Alcohol use No   family history includes Heart attack (age of onset: 77) in her mother; Heart disease in her mother; Hypertension in her daughter, mother, sister, and son.  ROS as above:  Medications: Current Outpatient Prescriptions  Medication Sig Dispense Refill  . AMBULATORY NON FORMULARY MEDICATION Medication Name: Glucometer with lancets and strips. Dx diabetes, not on insulin therapy.  Test once a day. Box of 100. 1 Units 0  . aspirin 81 MG tablet Take 81 mg by mouth daily.    Marland Kitchen atorvastatin (LIPITOR) 40 MG tablet Take 1 tablet (40 mg total) by mouth daily. 90 tablet 0  . losartan-hydrochlorothiazide (HYZAAR) 100-12.5 MG  tablet Take 1 tablet by mouth daily. 90 tablet 0  . MULTIPLE VITAMIN PO Take by mouth.     No current facility-administered medications for this visit.    Allergies  Allergen Reactions  . Simvastatin Nausea And Vomiting    gerd gerd    Health Maintenance Health Maintenance  Topic Date Due  . DEXA SCAN  05/28/2006  . INFLUENZA VACCINE  12/09/2016  . TETANUS/TDAP  04/20/2021  . COLONOSCOPY  07/04/2022  . PNA vac Low Risk Adult  Completed     Exam:  BP 139/62   Pulse 88   Wt 126 lb (57.2 kg)   BMI 22.14 kg/m    Gen: Well NAD nontoxic appearing HEENT: EOMI,  MMM Lungs: Normal work of breathing. CTABL Heart: RRR no MRG Abd: NABS, Soft. Nondistended, Nontender no renal artery bruits present Exts: Brisk capillary refill, warm and well perfused.   Component     Latest Ref Rng & Units 06/02/2012 12/31/2014 01/31/2015 08/05/2016  Creatinine     0.60 - 0.93 mg/dL 1.07 1.19 (H) 1.23 (H) 1.53 (H)   Component     Latest Ref Rng & Units 06/02/2012 12/31/2014 01/31/2015 08/05/2016  GFR, Est African American     >=60 mL/min 61 52 (L) 50 (L) 38 (L)     No results found for this or any previous visit (from the past 72 hour(s)). No results found.    Assessment and Plan: 76 y.o. female with  Worsening chronic kidney disease. We had a very lengthy discussion with the family regarding  the below diagnosis. Plan to broaden the workup to include kidney ultrasound and renal artery stenosis study. Additionally we'll recheck creatinine with a renal function panel as well as checking urine analysis and serum parathyroid hormone level and calcium level as well as erythropoietin. We'll send results to nephrologist. She has an appointment on April 30 at [facility] at P#[phone],F) [phone].  Additionally since almost all of her family members are present we had a discussion about goals of care and patient was given an advanced or active form.   Orders Placed This Encounter    Procedures  . US Renal Artery Stenosis    Standing Status:   Future    Standing Expiration Date:   10/19/2017    Order Specific Question:   Reason for Exam (SYMPTOM  OR DIAGNOSIS REQUIRED)    Answer:   eval CKD    Order Specific Question:   Preferred imaging location?    Answer:   [facility]  . US Renal    Standing Status:   Future    Standing Expiration Date:   10/19/2017    Order Specific Question:   Reason for Exam (SYMPTOM  OR DIAGNOSIS REQUIRED)    Answer:   eval CKD    Order Specific Question:   Preferred imaging location?    Answer:   [facility]  . Renal Function Panel  . Urinalysis, Routine w reflex microscopic  . PTH, Intact and Calcium  . Erythropoietin   No orders of the defined types were placed in this encounter.    Discussed warning signs or symptoms. Please see discharge instructions. Patient expresses understanding.  I spent 40 minutes with this patient, greater than 50% was face-to-face time counseling regarding the above diagnosis.

## 2016-08-20 ENCOUNTER — Encounter: Payer: Self-pay | Admitting: Surgery

## 2016-08-20 LAB — URINALYSIS, ROUTINE W REFLEX MICROSCOPIC
Bilirubin Urine: NEGATIVE
Glucose, UA: NEGATIVE
Hgb urine dipstick: NEGATIVE
Ketones, ur: NEGATIVE
NITRITE: NEGATIVE
Protein, ur: NEGATIVE
SPECIFIC GRAVITY, URINE: 1.015 (ref 1.001–1.035)
pH: 5 (ref 5.0–8.0)

## 2016-08-20 LAB — RENAL FUNCTION PANEL
Albumin: 3.9 g/dL (ref 3.6–5.1)
BUN: 14 mg/dL (ref 7–25)
CHLORIDE: 101 mmol/L (ref 98–110)
CO2: 30 mmol/L (ref 20–31)
Calcium: 9.5 mg/dL (ref 8.6–10.4)
Creat: 1.54 mg/dL — ABNORMAL HIGH (ref 0.60–0.93)
Glucose, Bld: 94 mg/dL (ref 65–99)
POTASSIUM: 4.5 mmol/L (ref 3.5–5.3)
Phosphorus: 3.2 mg/dL (ref 2.1–4.3)
SODIUM: 141 mmol/L (ref 135–146)

## 2016-08-20 LAB — URINALYSIS, MICROSCOPIC ONLY
Casts: NONE SEEN [LPF]
Crystals: NONE SEEN [HPF]
RBC / HPF: NONE SEEN RBC/HPF (ref <=2)
Yeast: NONE SEEN [HPF]

## 2016-08-20 LAB — PTH, INTACT AND CALCIUM
CALCIUM: 9.5 mg/dL (ref 8.6–10.4)
PTH: 53 pg/mL (ref 14–64)

## 2016-08-21 LAB — ERYTHROPOIETIN: Erythropoietin: 12.7 m[IU]/mL (ref 2.6–18.5)

## 2016-08-24 ENCOUNTER — Telehealth: Payer: Self-pay | Admitting: Family Medicine

## 2016-08-24 NOTE — Telephone Encounter (Signed)
Not that I am aware of. I will ask around.

## 2016-08-24 NOTE — Telephone Encounter (Signed)
-----   Message from Delrae Alfred, [place] sent at 08/06/2016  8:51 AM EDT ----- Pt notified of results and recommendations. Pt stated that it is hard her to get to doctors appointment due to her not having a car.  She wants to know are there any resources in the [place] area that could help her out with this issue. Please advise. -EH/RMA

## 2016-08-25 ENCOUNTER — Other Ambulatory Visit: Payer: Self-pay | Admitting: Surgery

## 2016-08-25 ENCOUNTER — Ambulatory Visit: Payer: Medicare Other

## 2016-08-25 ENCOUNTER — Other Ambulatory Visit: Payer: Medicare Other

## 2016-08-25 DIAGNOSIS — I739 Peripheral vascular disease, unspecified: Secondary | ICD-10-CM

## 2016-08-31 ENCOUNTER — Telehealth (HOSPITAL_COMMUNITY): Payer: Self-pay | Admitting: Surgery

## 2016-08-31 ENCOUNTER — Ambulatory Visit: Payer: Medicare Other | Admitting: Surgery

## 2016-08-31 ENCOUNTER — Encounter (HOSPITAL_COMMUNITY): Payer: Medicare Other

## 2016-08-31 NOTE — Telephone Encounter (Signed)
Dr. Vella Kohler at [place] @1015am .  Daughter Benjamine Mola aware.  Records faxed to [phone] attention Dena.

## 2016-08-31 NOTE — Telephone Encounter (Signed)
-----   Message from Serafina Mitchell, MD sent at 08/26/2016  5:23 PM EDT ----- Regarding: RE: Vascular surgery referral to [facility]: [phone] Please refer her to the vascular group at [facility], Dr. Vella Kohler.  Please let her know I am ok with the referral ----- Message ----- From: Rufina Falco Sent: 08/26/2016   2:27 PM To: Serafina Mitchell, MD, [facility] Clinical Pool Subject: Vascular surgery referral to [facility]           Patient's daughter, Benjamine Mola wants to know if you would refer Izza to a group in [place].    They live in [place] and it is hard to get her to [place].   She has no showed all appointments scheduled since 2016.     Benjamine Mola ask for a follow up phone call.  She can be reached at [phone].  Rip Harbour

## 2016-09-01 DIAGNOSIS — N183 Chronic kidney disease, stage 3 (moderate): Secondary | ICD-10-CM | POA: Diagnosis not present

## 2016-09-01 DIAGNOSIS — I10 Essential (primary) hypertension: Secondary | ICD-10-CM | POA: Diagnosis not present

## 2016-09-02 ENCOUNTER — Telehealth: Payer: Self-pay | Admitting: Family Medicine

## 2016-09-02 DIAGNOSIS — I701 Atherosclerosis of renal artery: Secondary | ICD-10-CM

## 2016-09-02 NOTE — Telephone Encounter (Signed)
Call patient: Renal artery scan shows that there is some narrowing or stenosis of both renal arteries greater than 60%. 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 We could certainly get her in with vein and vascular to see if she could be a candidate for any type of stenting or if they just recommend that it be followed. Please see which she would like to do. As far as transportation issues, I did see that she had called about a question for that. There is a place here in town called the [facility] which is specifically for seniors. And they do provide services for transportation to doctor's appointments. It just has to be scheduled ahead of time.  Beatrice Lecher, MD

## 2016-09-03 NOTE — Telephone Encounter (Signed)
Discussed information with pts daughter per pt request. They are agreeable to having a referral to vein and vascular. Advised daughter of services through the [facility] and provided her with their contact number.

## 2016-09-04 NOTE — Telephone Encounter (Signed)
Ok to place referral for amb vasc in [place] (internal refe).

## 2016-09-04 NOTE — Telephone Encounter (Signed)
Referral placed.

## 2016-09-07 DIAGNOSIS — I10 Essential (primary) hypertension: Secondary | ICD-10-CM | POA: Diagnosis not present

## 2016-09-07 DIAGNOSIS — N183 Chronic kidney disease, stage 3 (moderate): Secondary | ICD-10-CM | POA: Diagnosis not present

## 2016-09-07 DIAGNOSIS — I701 Atherosclerosis of renal artery: Secondary | ICD-10-CM | POA: Diagnosis not present

## 2016-09-08 ENCOUNTER — Other Ambulatory Visit: Payer: Medicare Other

## 2016-09-08 ENCOUNTER — Ambulatory Visit: Payer: Medicare Other

## 2016-09-15 ENCOUNTER — Other Ambulatory Visit: Payer: Medicare Other

## 2016-09-15 ENCOUNTER — Ambulatory Visit: Payer: Medicare Other

## 2016-09-22 ENCOUNTER — Ambulatory Visit (INDEPENDENT_AMBULATORY_CARE_PROVIDER_SITE_OTHER): Payer: Medicare Other

## 2016-09-22 DIAGNOSIS — Z1231 Encounter for screening mammogram for malignant neoplasm of breast: Secondary | ICD-10-CM

## 2016-09-22 DIAGNOSIS — M85851 Other specified disorders of bone density and structure, right thigh: Secondary | ICD-10-CM

## 2016-09-22 DIAGNOSIS — M8588 Other specified disorders of bone density and structure, other site: Secondary | ICD-10-CM | POA: Diagnosis not present

## 2016-09-22 DIAGNOSIS — Z780 Asymptomatic menopausal state: Secondary | ICD-10-CM

## 2016-09-22 DIAGNOSIS — Z1239 Encounter for other screening for malignant neoplasm of breast: Secondary | ICD-10-CM

## 2016-09-23 ENCOUNTER — Encounter: Payer: Self-pay | Admitting: Family Medicine

## 2016-09-23 DIAGNOSIS — M8580 Other specified disorders of bone density and structure, unspecified site: Secondary | ICD-10-CM | POA: Insufficient documentation

## 2016-09-25 DIAGNOSIS — Z720 Tobacco use: Secondary | ICD-10-CM | POA: Diagnosis not present

## 2016-09-25 DIAGNOSIS — I701 Atherosclerosis of renal artery: Secondary | ICD-10-CM | POA: Diagnosis not present

## 2016-09-25 DIAGNOSIS — I10 Essential (primary) hypertension: Secondary | ICD-10-CM | POA: Diagnosis not present

## 2016-09-25 DIAGNOSIS — I6523 Occlusion and stenosis of bilateral carotid arteries: Secondary | ICD-10-CM | POA: Diagnosis not present

## 2016-09-25 DIAGNOSIS — I739 Peripheral vascular disease, unspecified: Secondary | ICD-10-CM | POA: Diagnosis not present

## 2016-10-20 ENCOUNTER — Ambulatory Visit: Payer: Medicare Other | Admitting: Family Medicine

## 2016-10-20 DIAGNOSIS — Z0189 Encounter for other specified special examinations: Secondary | ICD-10-CM

## 2016-11-03 ENCOUNTER — Other Ambulatory Visit: Payer: Self-pay | Admitting: Family Medicine

## 2016-11-03 ENCOUNTER — Other Ambulatory Visit: Payer: Self-pay

## 2016-11-03 MED ORDER — LOSARTAN POTASSIUM-HCTZ 100-12.5 MG PO TABS: 1.0000 | ORAL_TABLET | Freq: Every day | ORAL | 0 refills | Status: DC

## 2016-12-03 ENCOUNTER — Other Ambulatory Visit: Payer: Self-pay | Admitting: Family Medicine

## 2016-12-03 ENCOUNTER — Other Ambulatory Visit: Payer: Self-pay | Admitting: *Deleted

## 2016-12-03 MED ORDER — LOSARTAN POTASSIUM-HCTZ 100-12.5 MG PO TABS: 1.0000 | ORAL_TABLET | Freq: Every day | ORAL | 0 refills | Status: DC

## 2016-12-07 ENCOUNTER — Ambulatory Visit: Payer: Medicare Other | Admitting: Osteopathic Medicine

## 2016-12-28 ENCOUNTER — Ambulatory Visit (INDEPENDENT_AMBULATORY_CARE_PROVIDER_SITE_OTHER): Payer: Medicare Other | Admitting: Family Medicine

## 2016-12-28 VITALS — BP 140/75 | HR 94 | Wt 131.0 lb

## 2016-12-28 DIAGNOSIS — N183 Chronic kidney disease, stage 3 unspecified: Secondary | ICD-10-CM

## 2016-12-28 DIAGNOSIS — Z720 Tobacco use: Secondary | ICD-10-CM | POA: Diagnosis not present

## 2016-12-28 DIAGNOSIS — E782 Mixed hyperlipidemia: Secondary | ICD-10-CM

## 2016-12-28 DIAGNOSIS — I701 Atherosclerosis of renal artery: Secondary | ICD-10-CM | POA: Diagnosis not present

## 2016-12-28 DIAGNOSIS — I739 Peripheral vascular disease, unspecified: Secondary | ICD-10-CM | POA: Diagnosis not present

## 2016-12-28 MED ORDER — ATORVASTATIN CALCIUM 40 MG PO TABS: 40.0000 mg | ORAL_TABLET | Freq: Every day | ORAL | 3 refills | Status: DC

## 2016-12-28 MED ORDER — LOSARTAN POTASSIUM-HCTZ 100-12.5 MG PO TABS: 1.0000 | ORAL_TABLET | Freq: Every day | ORAL | 3 refills | Status: DC

## 2016-12-28 NOTE — Patient Instructions (Addendum)
Thank you for coming in today. Get blood work today.  Continue current medicine.  Recheck with me every 6 months or sooner if needed.   Please schedule a nurse visit for a flu shot in a few weeks.

## 2016-12-28 NOTE — Progress Notes (Signed)
Cassidy Jennings is a 76 y.o. female who presents to [place] today for  Follow-up hypertension, chronic kidney disease,peripheral  arterial disease, smoking.  Hypertension: Patient takes losartan/hydrochlorothiazide listed below. She tolerates the medications well with no issues. She denies any chest pain palpitations r shortness of breath lightheadedness or dizziness.  Her hypertension is complicated by mild renal artery stenosis. Per nephrology angiotensin receptor blockers are  Reasonable to use in this situation.    Chronic kidney disease: Doing well with losartan. This is also managed with nephrology. She feels well.  Peripheral vascular disease: Patient has peripheral vascular disease and renal artery stenosis. She has reduced her smoking and currently takes antilipid medication. She feels well and denies any significant leg pain. She remains physically active.  Smoking: Patient has reduced smoking to about 7 cigarettes per day. She feels well with his current amount.  Past Medical History:  Diagnosis Date  . Chronic kidney disease   . Hyperlipidemia   . Hypertension   . Prediabetes    Past Surgical History:  Procedure Laterality Date  . TUBAL LIGATION     Social History  Substance Use Topics  . Smoking status: Current Every Day Smoker    Packs/day: 0.75    Years: 63.00    Types: Cigarettes  . Smokeless tobacco: Not on file  . Alcohol use No   family history includes Heart attack (age of onset: 60) in her mother; Heart disease in her mother; Hypertension in her daughter, mother, sister, and son.  ROS as above:  Medications: Current Outpatient Prescriptions  Medication Sig Dispense Refill  . AMBULATORY NON FORMULARY MEDICATION Medication Name: Glucometer with lancets and strips. Dx diabetes, not on insulin therapy.  Test once a day. Box of 100. 1  Units 0  . aspirin 81 MG tablet Take 81 mg by mouth daily.    Marland Kitchen atorvastatin (LIPITOR) 40 MG tablet Take 1 tablet (40 mg total) by mouth daily. 90 tablet 3  . losartan-hydrochlorothiazide (HYZAAR) 100-12.5 MG tablet Take 1 tablet by mouth daily. 90 tablet 3  . MULTIPLE VITAMIN PO Take by mouth.     No current facility-administered medications for this visit.    Allergies  Allergen Reactions  . Simvastatin Nausea And Vomiting    gerd gerd    Health Maintenance Health Maintenance  Topic Date Due  . INFLUENZA VACCINE  12/09/2016  . TETANUS/TDAP  04/20/2021  . COLONOSCOPY  07/04/2022  . DEXA SCAN  Completed  . PNA vac Low Risk Adult  Completed     Exam:  BP 140/75   Pulse 94   Wt 131 lb (59.4 kg)   SpO2 98%   BMI 23.02 kg/m  Gen: Well NAD HEENT: EOMI,  MMM Lungs: Normal work of breathing. CTABL Heart: RRR no MRG Abd: NABS, Soft. Nondistended, Nontender Exts: Brisk capillary refill, warm and well perfused.    Results for orders placed or performed in visit on 12/28/16 (from the past 72 hour(s))  COMPLETE METABOLIC PANEL WITH GFR     Status: Abnormal   Collection Time: 12/28/16  3:16 PM  Result Value Ref Range   Sodium 139 135 - 146 mmol/L   Potassium 4.1 3.5 - 5.3 mmol/L   Chloride 98 98 - 110 mmol/L   CO2 31 20 - 32 mmol/L    Comment: ** Please note change in reference range(s). **      Glucose, Bld 116 (H) 65 -  99 mg/dL   BUN 13 7 - 25 mg/dL   Creat 1.37 (H) 0.60 - 0.93 mg/dL    Comment:   For patients > or = 75 years of age: The upper reference limit for Creatinine is approximately 13% higher for people identified as African-American.      Total Bilirubin 0.6 0.2 - 1.2 mg/dL   Alkaline Phosphatase 67 33 - 130 U/L   AST 18 10 - 35 U/L   ALT 18 6 - 29 U/L   Total Protein 6.9 6.1 - 8.1 g/dL   Albumin 3.9 3.6 - 5.1 g/dL   Calcium 9.3 8.6 - 10.4 mg/dL   GFR, Est African American 44 (L) >=60 mL/min   GFR, Est Non African American 38 (L) >=60 mL/min   No  results found.    Assessment and Plan: 76 y.o. female with  Hypertension: Doing reasonably well with blood pressure control. Continue current regimen check metabolic panel as above. Continue losartan/hydrochlorothiazide.  Hyperlipidemia: Doing well at last check. Continue atorvastatin. Check metabolic panel for transaminitis.  Peripheral vascular disease: Doing well. Continue good lipid management. Reduce smoking and continue to follow along.  Smoking: Continue to reduce smoking.  Recheck every 6 months or so or sooner if needed. Recommend flu vaccine this fall.  Orders Placed This Encounter  Procedures  . COMPLETE METABOLIC PANEL WITH GFR   Meds ordered this encounter  Medications  . atorvastatin (LIPITOR) 40 MG tablet    Sig: Take 1 tablet (40 mg total) by mouth daily.    Dispense:  90 tablet    Refill:  3  . losartan-hydrochlorothiazide (HYZAAR) 100-12.5 MG tablet    Sig: Take 1 tablet by mouth daily.    Dispense:  90 tablet    Refill:  3     Discussed warning signs or symptoms. Please see discharge instructions. Patient expresses understanding.

## 2016-12-29 DIAGNOSIS — I701 Atherosclerosis of renal artery: Secondary | ICD-10-CM | POA: Insufficient documentation

## 2016-12-29 LAB — COMPLETE METABOLIC PANEL WITH GFR
ALBUMIN: 3.9 g/dL (ref 3.6–5.1)
ALK PHOS: 67 U/L (ref 33–130)
ALT: 18 U/L (ref 6–29)
AST: 18 U/L (ref 10–35)
BILIRUBIN TOTAL: 0.6 mg/dL (ref 0.2–1.2)
BUN: 13 mg/dL (ref 7–25)
CALCIUM: 9.3 mg/dL (ref 8.6–10.4)
CO2: 31 mmol/L (ref 20–32)
Chloride: 98 mmol/L (ref 98–110)
Creat: 1.37 mg/dL — ABNORMAL HIGH (ref 0.60–0.93)
GFR, EST AFRICAN AMERICAN: 44 mL/min — AB (ref >=60)
GFR, EST NON AFRICAN AMERICAN: 38 mL/min — AB (ref >=60)
Glucose, Bld: 116 mg/dL — ABNORMAL HIGH (ref 65–99)
Potassium: 4.1 mmol/L (ref 3.5–5.3)
Sodium: 139 mmol/L (ref 135–146)
TOTAL PROTEIN: 6.9 g/dL (ref 6.1–8.1)

## 2017-01-07 DIAGNOSIS — N183 Chronic kidney disease, stage 3 (moderate): Secondary | ICD-10-CM | POA: Diagnosis not present

## 2017-01-07 DIAGNOSIS — I701 Atherosclerosis of renal artery: Secondary | ICD-10-CM | POA: Diagnosis not present

## 2017-01-07 DIAGNOSIS — I10 Essential (primary) hypertension: Secondary | ICD-10-CM | POA: Diagnosis not present

## 2017-01-13 ENCOUNTER — Ambulatory Visit (INDEPENDENT_AMBULATORY_CARE_PROVIDER_SITE_OTHER): Payer: Medicare Other | Admitting: Family Medicine

## 2017-01-13 DIAGNOSIS — Z23 Encounter for immunization: Secondary | ICD-10-CM | POA: Diagnosis not present

## 2017-04-26 DIAGNOSIS — I739 Peripheral vascular disease, unspecified: Secondary | ICD-10-CM | POA: Diagnosis not present

## 2017-04-26 DIAGNOSIS — I701 Atherosclerosis of renal artery: Secondary | ICD-10-CM | POA: Diagnosis not present

## 2017-04-26 DIAGNOSIS — I6523 Occlusion and stenosis of bilateral carotid arteries: Secondary | ICD-10-CM | POA: Diagnosis not present

## 2017-05-05 DIAGNOSIS — R0981 Nasal congestion: Secondary | ICD-10-CM | POA: Diagnosis not present

## 2017-05-05 DIAGNOSIS — R05 Cough: Secondary | ICD-10-CM | POA: Diagnosis not present

## 2017-05-05 DIAGNOSIS — M79605 Pain in left leg: Secondary | ICD-10-CM | POA: Diagnosis not present

## 2017-05-05 DIAGNOSIS — F17210 Nicotine dependence, cigarettes, uncomplicated: Secondary | ICD-10-CM | POA: Diagnosis not present

## 2017-05-05 DIAGNOSIS — I129 Hypertensive chronic kidney disease with stage 1 through stage 4 chronic kidney disease, or unspecified chronic kidney disease: Secondary | ICD-10-CM | POA: Diagnosis not present

## 2017-05-05 DIAGNOSIS — Z888 Allergy status to other drugs, medicaments and biological substances status: Secondary | ICD-10-CM | POA: Diagnosis not present

## 2017-05-05 DIAGNOSIS — Z720 Tobacco use: Secondary | ICD-10-CM | POA: Diagnosis not present

## 2017-05-05 DIAGNOSIS — M79652 Pain in left thigh: Secondary | ICD-10-CM | POA: Diagnosis not present

## 2017-05-05 DIAGNOSIS — N189 Chronic kidney disease, unspecified: Secondary | ICD-10-CM | POA: Diagnosis not present

## 2017-05-05 DIAGNOSIS — Z7982 Long term (current) use of aspirin: Secondary | ICD-10-CM | POA: Diagnosis not present

## 2017-05-05 DIAGNOSIS — M25552 Pain in left hip: Secondary | ICD-10-CM | POA: Diagnosis not present

## 2017-05-05 DIAGNOSIS — E785 Hyperlipidemia, unspecified: Secondary | ICD-10-CM | POA: Diagnosis not present

## 2017-05-05 DIAGNOSIS — J209 Acute bronchitis, unspecified: Secondary | ICD-10-CM | POA: Diagnosis not present

## 2017-05-05 DIAGNOSIS — Z79899 Other long term (current) drug therapy: Secondary | ICD-10-CM | POA: Diagnosis not present

## 2017-05-06 DIAGNOSIS — M79605 Pain in left leg: Secondary | ICD-10-CM | POA: Diagnosis not present

## 2017-05-06 DIAGNOSIS — R05 Cough: Secondary | ICD-10-CM | POA: Diagnosis not present

## 2017-05-12 ENCOUNTER — Telehealth: Payer: Self-pay | Admitting: Family Medicine

## 2017-05-12 DIAGNOSIS — Z79899 Other long term (current) drug therapy: Secondary | ICD-10-CM | POA: Diagnosis not present

## 2017-05-12 DIAGNOSIS — G4489 Other headache syndrome: Secondary | ICD-10-CM | POA: Diagnosis not present

## 2017-05-12 DIAGNOSIS — F17210 Nicotine dependence, cigarettes, uncomplicated: Secondary | ICD-10-CM | POA: Diagnosis not present

## 2017-05-12 DIAGNOSIS — E785 Hyperlipidemia, unspecified: Secondary | ICD-10-CM | POA: Diagnosis not present

## 2017-05-12 DIAGNOSIS — Z720 Tobacco use: Secondary | ICD-10-CM | POA: Diagnosis not present

## 2017-05-12 DIAGNOSIS — Z888 Allergy status to other drugs, medicaments and biological substances status: Secondary | ICD-10-CM | POA: Diagnosis not present

## 2017-05-12 DIAGNOSIS — Z7982 Long term (current) use of aspirin: Secondary | ICD-10-CM | POA: Diagnosis not present

## 2017-05-12 DIAGNOSIS — Z8673 Personal history of transient ischemic attack (TIA), and cerebral infarction without residual deficits: Secondary | ICD-10-CM | POA: Diagnosis not present

## 2017-05-12 DIAGNOSIS — R51 Headache: Secondary | ICD-10-CM | POA: Diagnosis not present

## 2017-05-12 DIAGNOSIS — R52 Pain, unspecified: Secondary | ICD-10-CM | POA: Diagnosis not present

## 2017-05-12 DIAGNOSIS — I10 Essential (primary) hypertension: Secondary | ICD-10-CM | POA: Diagnosis not present

## 2017-05-12 DIAGNOSIS — N189 Chronic kidney disease, unspecified: Secondary | ICD-10-CM | POA: Diagnosis not present

## 2017-05-12 DIAGNOSIS — R9431 Abnormal electrocardiogram [ECG] [EKG]: Secondary | ICD-10-CM | POA: Diagnosis not present

## 2017-05-12 DIAGNOSIS — I129 Hypertensive chronic kidney disease with stage 1 through stage 4 chronic kidney disease, or unspecified chronic kidney disease: Secondary | ICD-10-CM | POA: Diagnosis not present

## 2017-05-12 DIAGNOSIS — F17200 Nicotine dependence, unspecified, uncomplicated: Secondary | ICD-10-CM | POA: Diagnosis not present

## 2017-05-12 DIAGNOSIS — R4182 Altered mental status, unspecified: Secondary | ICD-10-CM | POA: Diagnosis not present

## 2017-05-12 NOTE — Telephone Encounter (Signed)
I see that Cassidy Jennings has had several ER visits recently.  Please schedule a follow up visit with me soonish.

## 2017-05-12 NOTE — Telephone Encounter (Signed)
Spoke to patient gave her advise as noted below. Patient stated that she will plan to calkl the office and schedule a follow up. Jakita Dutkiewicz,CMA

## 2017-05-14 DIAGNOSIS — I701 Atherosclerosis of renal artery: Secondary | ICD-10-CM | POA: Diagnosis not present

## 2017-05-14 DIAGNOSIS — I739 Peripheral vascular disease, unspecified: Secondary | ICD-10-CM | POA: Diagnosis not present

## 2017-05-14 DIAGNOSIS — I6523 Occlusion and stenosis of bilateral carotid arteries: Secondary | ICD-10-CM | POA: Diagnosis not present

## 2017-05-17 ENCOUNTER — Encounter: Payer: Self-pay | Admitting: Family Medicine

## 2017-05-17 ENCOUNTER — Ambulatory Visit (INDEPENDENT_AMBULATORY_CARE_PROVIDER_SITE_OTHER): Payer: Medicare Other | Admitting: Family Medicine

## 2017-05-17 VITALS — BP 129/73 | HR 87 | Wt 125.0 lb

## 2017-05-17 DIAGNOSIS — N183 Chronic kidney disease, stage 3 unspecified: Secondary | ICD-10-CM

## 2017-05-17 DIAGNOSIS — I10 Essential (primary) hypertension: Secondary | ICD-10-CM | POA: Diagnosis not present

## 2017-05-17 DIAGNOSIS — E782 Mixed hyperlipidemia: Secondary | ICD-10-CM | POA: Diagnosis not present

## 2017-05-17 DIAGNOSIS — R7303 Prediabetes: Secondary | ICD-10-CM | POA: Diagnosis not present

## 2017-05-17 MED ORDER — LOSARTAN POTASSIUM-HCTZ 100-25 MG PO TABS: 1.0000 | ORAL_TABLET | Freq: Every day | ORAL | 1 refills | Status: DC

## 2017-05-17 NOTE — Patient Instructions (Addendum)
Thank you for coming in today. Continue the higher dose of losartan/HCTZ (100/25) daily.  Get fasting labs next week.  Follow up in February as scheduled.  Quit smoking.

## 2017-05-17 NOTE — Progress Notes (Signed)
Cassidy Jennings is a 77 y.o. female who presents to [place]: [facility] today for hospital follow up.  Patient has been hospitalized twice in the last two weeks. The first visit was for shortness of breath and URI symptoms. Providers were concerned for COPD exacerbation and prescribed albuterol inhaler PRN. Patient improved when home, with minimal need for albuterol. Second ED visit was last Thursday for severe headache and hypertension to the 200s. Patient had severe headache at the time so was brought to the ED for hypertensive urgency. Patient discharged on increased dose of losartan/hctz. Now on 100/25. Patient doing well today. Denies headache, blurred vision, shortness of breath, chest pain, or claudication (given PAD history). Patient resistant to taking albuterol because she is concerned that it caused acute rise in blood pressure.    Past Medical History:  Diagnosis Date  . Chronic kidney disease   . Hyperlipidemia   . Hypertension   . Prediabetes    Past Surgical History:  Procedure Laterality Date  . TUBAL LIGATION     Social History   Tobacco Use  . Smoking status: Current Every Day Smoker    Packs/day: 0.75    Years: 63.00    Pack years: 47.25    Types: Cigarettes  . Smokeless tobacco: Never Used  Substance Use Topics  . Alcohol use: No    Alcohol/week: 0.0 oz   family history includes Heart attack (age of onset: 52) in her mother; Heart disease in her mother; Hypertension in her daughter, mother, sister, and son.  ROS as above:  Medications: Current Outpatient Medications  Medication Sig Dispense Refill  . AMBULATORY NON FORMULARY MEDICATION Medication Name: Glucometer with lancets and strips. Dx diabetes, not on insulin therapy.  Test once a day. Box of 100. 1 Units 0  . aspirin 81 MG tablet Take 81 mg by mouth daily.    Marland Kitchen atorvastatin (LIPITOR)  40 MG tablet Take 1 tablet (40 mg total) by mouth daily. 90 tablet 3  . losartan-hydrochlorothiazide (HYZAAR) 100-25 MG tablet Take 1 tablet by mouth daily. 90 tablet 1  . MULTIPLE VITAMIN PO Take by mouth.     No current facility-administered medications for this visit.    Allergies  Allergen Reactions  . Simvastatin Nausea And Vomiting    gerd gerd    Health Maintenance Health Maintenance  Topic Date Due  . TETANUS/TDAP  04/20/2021  . COLONOSCOPY  07/04/2022  . INFLUENZA VACCINE  Completed  . DEXA SCAN  Completed  . PNA vac Low Risk Adult  Completed     Exam:  BP 129/73   Pulse 87   Wt 125 lb (56.7 kg)   BMI 21.97 kg/m  Gen: Well NAD HEENT: EOMI,  MMM. Lungs: Distant breath sounds. No crackles or wheezes appreciated. Normal work of breathing. Heart: RRR no MRG Abd: NABS, Soft. Nondistended, Nontender Exts: Brisk capillary refill, warm and well perfused.   No results found for this or any previous visit (from the past 72 hour(s)). No results found.  Assessment and Plan: 77 y.o. female with  Hypertension: losartan/HCTZ increased to 100/25. BP good today at 129/73. Patient will continue at this new increased dose. Needs BMP next week to assess creatinine, especially given renal artery stenosis.  Hyperlipidemia: continue atorvastatin. Will assess cholesterol next week with labs.  PAD: doing well. Continue statin. Saw vascular surgeon last week and no need for intervention. Vascular will see patient again next year.  Smoking: patient has no smoked in last 2 weeks. Endorses dizziness with "fumes". Encouraged continued smoking cessation.    Orders Placed This Encounter  Procedures  . COMPLETE METABOLIC PANEL WITH GFR  . Lipid Panel w/reflex Direct LDL  . Hemoglobin A1c   Meds ordered this encounter  Medications  . losartan-hydrochlorothiazide (HYZAAR) 100-25 MG tablet    Sig: Take 1 tablet by mouth daily.    Dispense:  90 tablet    Refill:  1   Discussed  warning signs or symptoms. Please see discharge instructions. Patient expresses understanding.

## 2017-06-04 DIAGNOSIS — E782 Mixed hyperlipidemia: Secondary | ICD-10-CM | POA: Diagnosis not present

## 2017-06-04 DIAGNOSIS — R7303 Prediabetes: Secondary | ICD-10-CM | POA: Diagnosis not present

## 2017-06-04 DIAGNOSIS — N183 Chronic kidney disease, stage 3 (moderate): Secondary | ICD-10-CM | POA: Diagnosis not present

## 2017-06-04 DIAGNOSIS — I10 Essential (primary) hypertension: Secondary | ICD-10-CM | POA: Diagnosis not present

## 2017-06-05 LAB — HEMOGLOBIN A1C
HEMOGLOBIN A1C: 6.4 %{Hb} — AB (ref <5.7)
MEAN PLASMA GLUCOSE: 137 (calc)
eAG (mmol/L): 7.6 (calc)

## 2017-06-05 LAB — COMPLETE METABOLIC PANEL WITH GFR
AG Ratio: 1.2 (calc) (ref 1.0–2.5)
ALKALINE PHOSPHATASE (APISO): 80 U/L (ref 33–130)
ALT: 17 U/L (ref 6–29)
AST: 18 U/L (ref 10–35)
Albumin: 3.9 g/dL (ref 3.6–5.1)
BILIRUBIN TOTAL: 0.5 mg/dL (ref 0.2–1.2)
BUN/Creatinine Ratio: 9 (calc) (ref 6–22)
BUN: 12 mg/dL (ref 7–25)
CHLORIDE: 101 mmol/L (ref 98–110)
CO2: 32 mmol/L (ref 20–32)
CREATININE: 1.38 mg/dL — AB (ref 0.60–0.93)
Calcium: 9.5 mg/dL (ref 8.6–10.4)
GFR, Est African American: 43 mL/min/{1.73_m2} — ABNORMAL LOW (ref >=60)
GFR, Est Non African American: 37 mL/min/{1.73_m2} — ABNORMAL LOW (ref >=60)
GLUCOSE: 107 mg/dL — AB (ref 65–99)
Globulin: 3.2 g/dL (calc) (ref 1.9–3.7)
Potassium: 3.9 mmol/L (ref 3.5–5.3)
Sodium: 140 mmol/L (ref 135–146)
TOTAL PROTEIN: 7.1 g/dL (ref 6.1–8.1)

## 2017-06-05 LAB — LIPID PANEL W/REFLEX DIRECT LDL
Cholesterol: 151 mg/dL (ref <200)
HDL: 41 mg/dL — ABNORMAL LOW (ref >50)
LDL CHOLESTEROL (CALC): 84 mg/dL
Non-HDL Cholesterol (Calc): 110 mg/dL (calc) (ref <130)
TRIGLYCERIDES: 155 mg/dL — AB (ref <150)
Total CHOL/HDL Ratio: 3.7 (calc) (ref <5.0)

## 2017-06-29 ENCOUNTER — Ambulatory Visit: Payer: Medicare Other | Admitting: Family Medicine

## 2017-06-30 ENCOUNTER — Ambulatory Visit: Payer: Medicare Other | Admitting: Family Medicine

## 2017-06-30 DIAGNOSIS — Z0189 Encounter for other specified special examinations: Secondary | ICD-10-CM

## 2017-07-02 ENCOUNTER — Telehealth: Payer: Self-pay | Admitting: Family Medicine

## 2017-07-02 NOTE — Telephone Encounter (Signed)
The losartan/HCTZ blood pressure pill may be subject to a recall. Please contact your pharmacy to see if your lot numbers are involved.   Also Cassidy Jennings has had 3 no-show appointments in the last year. We typically discharge patients from the practice at this time.  This is a final warning. Please keep your appointments. If you cannot make your appointments please contact my office ASAP so we can get other patients in. This keeps the wait time to see the doctor lower for everyone.   Please schedule follow up soon.

## 2017-07-02 NOTE — Telephone Encounter (Signed)
Poke to patient gave her advise as noted below. Patient advised of her 3 no shows and she verbally understands that she needs to keep her appointment set for 07/06/2017. Kahlil Cowans,CMA

## 2017-07-06 ENCOUNTER — Ambulatory Visit (INDEPENDENT_AMBULATORY_CARE_PROVIDER_SITE_OTHER): Payer: Medicare HMO | Admitting: Family Medicine

## 2017-07-06 ENCOUNTER — Encounter: Payer: Self-pay | Admitting: Family Medicine

## 2017-07-06 VITALS — BP 128/73 | HR 69 | Ht 63.0 in | Wt 126.0 lb

## 2017-07-06 DIAGNOSIS — R7303 Prediabetes: Secondary | ICD-10-CM

## 2017-07-06 DIAGNOSIS — I10 Essential (primary) hypertension: Secondary | ICD-10-CM | POA: Diagnosis not present

## 2017-07-06 DIAGNOSIS — I701 Atherosclerosis of renal artery: Secondary | ICD-10-CM

## 2017-07-06 DIAGNOSIS — N183 Chronic kidney disease, stage 3 unspecified: Secondary | ICD-10-CM

## 2017-07-06 NOTE — Progress Notes (Signed)
Cassidy Jennings is a 77 y.o. female who presents to [facility] [place] today for follow up HTN, CKD, prediabetes and smoking.   HTN: Cassidy Jennings had a hypertensive urgency crisis was 3 months ago.  Her losartan hydrochlorothiazide was increased to 100/25.  She tolerates medication well with no chest pain palpitations shortness of breath or significant lightheadedness or dizziness.  She does not check her blood pressure at home.  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  This is co-managed with vascular surgery.  She feels well with no significant ankle pain or swelling no shortness of breath or orthopnea.  Prediabetes: Cassidy Jennings is prediabetes worsened when last checked last month.  Her A1c was 6.4.  She notes that she was ill in December and November and received steroids.  She wonders if that may have contributed to her elevated blood sugar.  She denies checking her blood sugar regularly but denies polyuria or polydipsia.  Smoking: Cassidy Jennings continues to smoke about a half a pack a day.  She is trying to cut back but not able to quit at this time.  Past Medical History:  Diagnosis Date  . Chronic kidney disease   . Hyperlipidemia   . Hypertension   . Prediabetes    Past Surgical History:  Procedure Laterality Date  . TUBAL LIGATION     Social History   Tobacco Use  . Smoking status: Current Every Day Smoker    Packs/day: 0.75    Years: 63.00    Pack years: 47.25    Types: Cigarettes  . Smokeless tobacco: Never Used  Substance Use Topics  . Alcohol use: No    Alcohol/week: 0.0 oz   family history includes Heart attack (age of onset: 12) in her mother; Heart disease in her mother; Hypertension in her daughter, mother, sister, and son.  ROS as above:  Medications: Current Outpatient Medications  Medication Sig Dispense Refill  .  AMBULATORY NON FORMULARY MEDICATION Medication Name: Glucometer with lancets and strips. Dx diabetes, not on insulin therapy.  Test once a day. Box of 100. 1 Units 0  . aspirin 81 MG tablet Take 81 mg by mouth daily.    Marland Kitchen atorvastatin (LIPITOR) 40 MG tablet Take 1 tablet (40 mg total) by mouth daily. 90 tablet 3  . losartan-hydrochlorothiazide (HYZAAR) 100-25 MG tablet Take 1 tablet by mouth daily. 90 tablet 1  . MULTIPLE VITAMIN PO Take by mouth.     No current facility-administered medications for this visit.    Allergies  Allergen Reactions  . Simvastatin Nausea And Vomiting    gerd gerd    Health Maintenance Health Maintenance  Topic Date Due  . TETANUS/TDAP  04/20/2021  . INFLUENZA VACCINE  Completed  . DEXA SCAN  Completed  . PNA vac Low Risk Adult  Completed     Exam:  BP 128/73   Pulse 69   Ht 5\' 3"  (1.6 m)   Wt 126 lb (57.2 kg)   BMI 22.32 kg/m  Gen: Well NAD HEENT: EOMI,  MMM Lungs: Normal work of breathing. CTABL Heart: RRR no MRG Abd: NABS, Soft. Nondistended, Nontender Exts: Brisk capillary refill, warm and well perfused.     Chemistry      Component Value Date/Time   NA 140 06/04/2017 0906   K 3.9 06/04/2017 0906   CL 101 06/04/2017 0906   CO2 32 06/04/2017 0906   BUN  12 06/04/2017 0906   CREATININE 1.38 (H) 06/04/2017 0906      Component Value Date/Time   CALCIUM 9.5 06/04/2017 0906   ALKPHOS 67 12/28/2016 1516   AST 18 06/04/2017 0906   ALT 17 06/04/2017 0906   BILITOT 0.5 06/04/2017 0906     Lab Results  Component Value Date   HGBA1C 6.4 (H) 06/04/2017     No results found for this or any previous visit (from the past 72 hour(s)). No results found.    Assessment and Plan: 77 y.o. female with  HTN: Well-controlled.  Lab recheck which reassuringly stable.  Continue current regimen.  Recheck in 3 months.  Recommend home blood pressure checking regularly.  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  Creatinine  stable.  Continue blood pressure management and recheck every 3-6 months.  Prediabetes: We had a discussion about risk factors.  I do believe her prediabetes worsened because of her illness and steroids.  Will recheck A1c in 3 months if all is well continue normal care.  Avoid excessive carbohydrates if possible.  Smoking: Work on smoking cessation or cutting back.  Recheck in 3 months.  Discussed warning signs or symptoms. Please see discharge instructions. Patient expresses understanding.

## 2017-07-06 NOTE — Patient Instructions (Addendum)
Thank you for coming in today.  Recheck in 3 months.  Return sooner if needed.   Consider a nurse visit soon to adjust the blood pressure cuff.

## 2017-10-29 ENCOUNTER — Other Ambulatory Visit: Payer: Self-pay | Admitting: Family Medicine

## 2017-10-29 DIAGNOSIS — Z1231 Encounter for screening mammogram for malignant neoplasm of breast: Secondary | ICD-10-CM

## 2017-11-18 ENCOUNTER — Ambulatory Visit (INDEPENDENT_AMBULATORY_CARE_PROVIDER_SITE_OTHER): Payer: Medicare HMO

## 2017-11-18 DIAGNOSIS — Z1231 Encounter for screening mammogram for malignant neoplasm of breast: Secondary | ICD-10-CM | POA: Diagnosis not present

## 2017-12-11 ENCOUNTER — Other Ambulatory Visit: Payer: Self-pay | Admitting: Family Medicine

## 2018-01-12 ENCOUNTER — Other Ambulatory Visit: Payer: Self-pay | Admitting: Family Medicine

## 2018-01-19 ENCOUNTER — Other Ambulatory Visit: Payer: Self-pay

## 2018-01-19 ENCOUNTER — Other Ambulatory Visit: Payer: Self-pay | Admitting: Family Medicine

## 2018-01-19 DIAGNOSIS — E782 Mixed hyperlipidemia: Secondary | ICD-10-CM

## 2018-01-19 MED ORDER — ATORVASTATIN CALCIUM 40 MG PO TABS: 40.0000 mg | ORAL_TABLET | Freq: Every day | ORAL | 0 refills | Status: DC

## 2018-02-19 ENCOUNTER — Other Ambulatory Visit: Payer: Self-pay | Admitting: Family Medicine

## 2018-02-19 DIAGNOSIS — E782 Mixed hyperlipidemia: Secondary | ICD-10-CM

## 2018-02-21 ENCOUNTER — Other Ambulatory Visit: Payer: Self-pay | Admitting: Family Medicine

## 2018-02-21 DIAGNOSIS — E782 Mixed hyperlipidemia: Secondary | ICD-10-CM

## 2018-02-22 ENCOUNTER — Telehealth: Payer: Self-pay | Admitting: Family Medicine

## 2018-02-22 NOTE — Telephone Encounter (Signed)
Left VM with recommendation. Callback information provided.

## 2018-02-22 NOTE — Telephone Encounter (Signed)
Received note that the blood pressure medication losartan/hydrochlorothiazide has been recalled.  Patient is overdue for recheck in clinic.  Please schedule follow-up appoint with me in the near future.  Continue taking medication until you return to clinic.

## 2018-03-11 DIAGNOSIS — R69 Illness, unspecified: Secondary | ICD-10-CM | POA: Diagnosis not present

## 2018-03-14 ENCOUNTER — Encounter: Payer: Self-pay | Admitting: Family Medicine

## 2018-03-21 ENCOUNTER — Other Ambulatory Visit: Payer: Self-pay | Admitting: Family Medicine

## 2018-03-21 DIAGNOSIS — E782 Mixed hyperlipidemia: Secondary | ICD-10-CM

## 2018-03-28 ENCOUNTER — Telehealth: Payer: Self-pay | Admitting: Family Medicine

## 2018-03-28 NOTE — Telephone Encounter (Signed)
Patient overdue for flu vaccine.  Please schedule appointment.  Additionally she is overdue for recheck.

## 2018-03-28 NOTE — Telephone Encounter (Signed)
patient got flu vaccine on 03/11/18 chart updated and she stated that she will schedule appointment when she can set up to get transportation. Rhonda Cunningham,CMA

## 2018-03-29 ENCOUNTER — Telehealth: Payer: Self-pay | Admitting: Family Medicine

## 2018-03-29 NOTE — Telephone Encounter (Signed)
Patient looks like she ran out of blood pressure medicine.  If transportation is an issue there are several transportation options regionally.  Please schedule follow-up appointment.

## 2018-03-29 NOTE — Telephone Encounter (Signed)
Poke with patient she stated that she still has some blood pressure medication and that as soon as she can find out when her daughter off day is she will call and schedule an appointment. Marnae Madani,CMA

## 2018-04-12 ENCOUNTER — Ambulatory Visit (INDEPENDENT_AMBULATORY_CARE_PROVIDER_SITE_OTHER): Payer: Medicare HMO | Admitting: Family Medicine

## 2018-04-12 ENCOUNTER — Encounter: Payer: Self-pay | Admitting: Family Medicine

## 2018-04-12 VITALS — BP 122/70 | HR 78 | Wt 120.0 lb

## 2018-04-12 DIAGNOSIS — R7303 Prediabetes: Secondary | ICD-10-CM

## 2018-04-12 DIAGNOSIS — I739 Peripheral vascular disease, unspecified: Secondary | ICD-10-CM

## 2018-04-12 DIAGNOSIS — I10 Essential (primary) hypertension: Secondary | ICD-10-CM

## 2018-04-12 DIAGNOSIS — N183 Chronic kidney disease, stage 3 unspecified: Secondary | ICD-10-CM

## 2018-04-12 DIAGNOSIS — Z720 Tobacco use: Secondary | ICD-10-CM | POA: Diagnosis not present

## 2018-04-12 DIAGNOSIS — E782 Mixed hyperlipidemia: Secondary | ICD-10-CM

## 2018-04-12 DIAGNOSIS — I701 Atherosclerosis of renal artery: Secondary | ICD-10-CM | POA: Diagnosis not present

## 2018-04-12 DIAGNOSIS — I6523 Occlusion and stenosis of bilateral carotid arteries: Secondary | ICD-10-CM

## 2018-04-12 LAB — COMPLETE METABOLIC PANEL WITH GFR
AG Ratio: 1.3 (calc) (ref 1.0–2.5)
ALBUMIN MSPROF: 4 g/dL (ref 3.6–5.1)
ALKALINE PHOSPHATASE (APISO): 72 U/L (ref 33–130)
ALT: 14 U/L (ref 6–29)
AST: 18 U/L (ref 10–35)
BUN/Creatinine Ratio: 10 (calc) (ref 6–22)
BUN: 17 mg/dL (ref 7–25)
CALCIUM: 9.6 mg/dL (ref 8.6–10.4)
CHLORIDE: 101 mmol/L (ref 98–110)
CO2: 32 mmol/L (ref 20–32)
Creat: 1.73 mg/dL — ABNORMAL HIGH (ref 0.60–0.93)
GFR, Est African American: 33 mL/min/{1.73_m2} — ABNORMAL LOW (ref >=60)
GFR, Est Non African American: 28 mL/min/{1.73_m2} — ABNORMAL LOW (ref >=60)
GLUCOSE: 98 mg/dL (ref 65–99)
Globulin: 3 g/dL (calc) (ref 1.9–3.7)
POTASSIUM: 3.7 mmol/L (ref 3.5–5.3)
SODIUM: 139 mmol/L (ref 135–146)
TOTAL PROTEIN: 7 g/dL (ref 6.1–8.1)
Total Bilirubin: 0.6 mg/dL (ref 0.2–1.2)

## 2018-04-12 LAB — CBC
HCT: 37.2 % (ref 35.0–45.0)
HEMOGLOBIN: 12.8 g/dL (ref 11.7–15.5)
MCH: 31.8 pg (ref 27.0–33.0)
MCHC: 34.4 g/dL (ref 32.0–36.0)
MCV: 92.5 fL (ref 80.0–100.0)
MPV: 10.6 fL (ref 7.5–12.5)
Platelets: 224 10*3/uL (ref 140–400)
RBC: 4.02 10*6/uL (ref 3.80–5.10)
RDW: 11.7 % (ref 11.0–15.0)
WBC: 5.9 10*3/uL (ref 3.8–10.8)

## 2018-04-12 LAB — LIPID PANEL W/REFLEX DIRECT LDL
CHOLESTEROL: 165 mg/dL (ref <200)
HDL: 39 mg/dL — ABNORMAL LOW (ref >50)
LDL Cholesterol (Calc): 99 mg/dL (calc)
Non-HDL Cholesterol (Calc): 126 mg/dL (calc) (ref <130)
Total CHOL/HDL Ratio: 4.2 (calc) (ref <5.0)
Triglycerides: 167 mg/dL — ABNORMAL HIGH (ref <150)

## 2018-04-12 LAB — POCT GLYCOSYLATED HEMOGLOBIN (HGB A1C): Hemoglobin A1C: 6.4 % — AB (ref 4.0–5.6)

## 2018-04-12 MED ORDER — LOSARTAN POTASSIUM-HCTZ 100-25 MG PO TABS: 1.0000 | ORAL_TABLET | Freq: Every day | ORAL | 0 refills | Status: DC

## 2018-04-12 MED ORDER — ATORVASTATIN CALCIUM 40 MG PO TABS: ORAL_TABLET | ORAL | 0 refills | Status: DC

## 2018-04-12 NOTE — Patient Instructions (Addendum)
Thank you for coming in today.  Please schedule a follow up appointment with your vascular doctor.  Marjean Donna, MD  [address]  [place]  [place], [place] [address]  [phone]  [phone] (Fax)  Get labs today.   Continue medicines.  Return in 6 months or sooner if needed.  Let me know if you are not doing well.    Peripheral Vascular Disease Peripheral vascular disease (PVD) is a disease of the blood vessels that are not part of your heart and brain. A simple term for PVD is poor circulation. In most cases, PVD narrows the blood vessels that carry blood from your heart to the rest of your body. This can result in a decreased supply of blood to your arms, legs, and internal organs, like your stomach or kidneys. However, it most often affects a person's lower legs and feet. There are two types of PVD.  Organic PVD. This is the more common type. It is caused by damage to the structure of blood vessels.  Functional PVD. This is caused by conditions that make blood vessels contract and tighten (spasm).  Without treatment, PVD tends to get worse over time. PVD can also lead to acute ischemic limb. This is when an arm or limb suddenly has trouble getting enough blood. This is a medical emergency. What are the causes? Each type of PVD has many different causes. The most common cause of PVD is buildup of a fatty material (plaque) inside of your arteries (atherosclerosis). Small amounts of plaque can break off from the walls of the blood vessels and become lodged in a smaller artery. This blocks blood flow and can cause acute ischemic limb. Other common causes of PVD include:  Blood clots that form inside of blood vessels.  Injuries to blood vessels.  Diseases that cause inflammation of blood vessels or cause blood vessel spasms.  Health behaviors and health history that increase your risk of developing PVD.  What increases the risk? You may have a greater  risk of PVD if you:  Have a family history of PVD.  Have certain medical conditions, including: ? High cholesterol. ? Diabetes. ? High blood pressure (hypertension). ? Coronary heart disease. ? Past problems with blood clots. ? Past injury, such as burns or a broken bone. These may have damaged blood vessels in your limbs. ? Buerger disease. This is caused by inflamed blood vessels in your hands and feet. ? Some forms of arthritis. ? Rare birth defects that affect the arteries in your legs.  Use tobacco.  Do not get enough exercise.  Are obese.  Are age 28 or older.  What are the signs or symptoms? PVD may cause many different symptoms. Your symptoms depend on what part of your body is not getting enough blood. Some common signs and symptoms include:  Cramps in your lower legs. This may be a symptom of poor leg circulation (claudication).  Pain and weakness in your legs while you are physically active that goes away when you rest (intermittent claudication).  Leg pain when at rest.  Leg numbness, tingling, or weakness.  Coldness in a leg or foot, especially when compared with the other leg.  Skin or hair changes. These can include: ? Hair loss. ? Shiny skin. ? Pale or bluish skin. ? Thick toenails.  Inability to get or maintain an erection (erectile dysfunction).  People with PVD are more prone to developing ulcers and sores on their toes, feet, or legs. These  may take longer than normal to heal. How is this diagnosed? Your health care provider may diagnose PVD from your signs and symptoms. The health care provider will also do a physical exam. You may have tests to find out what is causing your PVD and determine its severity. Tests may include:  Blood pressure recordings from your arms and legs and measurements of the strength of your pulses (pulse volume recordings).  Imaging studies using sound waves to take pictures of the blood flow through your blood vessels  (Doppler ultrasound).  Injecting a dye into your blood vessels before having imaging studies using: ? X-rays (angiogram or arteriogram). ? Computer-generated X-rays (CT angiogram). ? A powerful electromagnetic field and a computer (magnetic resonance angiogram or MRA).  How is this treated? Treatment for PVD depends on the cause of your condition and the severity of your symptoms. It also depends on your age. Underlying causes need to be treated and controlled. These include long-lasting (chronic) conditions, such as diabetes, high cholesterol, and high blood pressure. You may need to first try making lifestyle changes and taking medicines. Surgery may be needed if these do not work. Lifestyle changes may include:  Quitting smoking.  Exercising regularly.  Following a low-fat, low-cholesterol diet.  Medicines may include:  Blood thinners to prevent blood clots.  Medicines to improve blood flow.  Medicines to improve your blood cholesterol levels.  Surgical procedures may include:  A procedure that uses an inflated balloon to open a blocked artery and improve blood flow (angioplasty).  A procedure to put in a tube (stent) to keep a blocked artery open (stent implant).  Surgery to reroute blood flow around a blocked artery (peripheral bypass surgery).  Surgery to remove dead tissue from an infected wound on the affected limb.  Amputation. This is surgical removal of the affected limb. This may be necessary in cases of acute ischemic limb that are not improved through medical or surgical treatments.  Follow these instructions at home:  Take medicines only as directed by your health care provider.  Do not use any tobacco products, including cigarettes, chewing tobacco, or electronic cigarettes. If you need help quitting, ask your health care provider.  Lose weight if you are overweight, and maintain a healthy weight as directed by your health care provider.  Eat a diet that  is low in fat and cholesterol. If you need help, ask your health care provider.  Exercise regularly. Ask your health care provider to suggest some good activities for you.  Use compression stockings or other mechanical devices as directed by your health care provider.  Take good care of your feet. ? Wear comfortable shoes that fit well. ? Check your feet often for any cuts or sores. Contact a health care provider if:  You have cramps in your legs while walking.  You have leg pain when you are at rest.  You have coldness in a leg or foot.  Your skin changes.  You have erectile dysfunction.  You have cuts or sores on your feet that are not healing. Get help right away if:  Your arm or leg turns cold and blue.  Your arms or legs become red, warm, swollen, painful, or numb.  You have chest pain or trouble breathing.  You suddenly have weakness in your face, arm, or leg.  You become very confused or lose the ability to speak.  You suddenly have a very bad headache or lose your vision. This information is not intended to  replace advice given to you by your health care provider. Make sure you discuss any questions you have with your health care provider. Document Released: 06/04/2004 Document Revised: 10/03/2015 Document Reviewed: 10/05/2013 Elsevier Interactive Patient Education  2017 [employer].

## 2018-04-12 NOTE — Progress Notes (Signed)
Cassidy Jennings is a 77 y.o. female who presents to [facility]: [place] today for follow-up hypertension, hyperlipidemia, peripheral vascular disease and chronic kidney disease.  Cassidy Jennings is doing quite well.  She denies any acute medical issues recently.  She takes her medications listed below.  She notes her blood pressures typically well controlled.  No chest pain palpitations or shortness of breath.  She takes atorvastatin regularly for hyperlipidemia with peripheral vascular disease.  She denies significant muscle aches or pains.  She notes that her peripheral arterial disease is doing well.  She was last seen for this in January 2019 by vascular at [facility].  The plan was for recheck in 1 year.  She has not scheduled an appointment yet.  She denies significant leg pain with ambulation.  She also has a diagnosis of chronic kidney disease.  She denies significant swelling.  She continues to smoke and is not interested in quitting smoking at this time.   ROS as above:  Exam:  BP 122/70   Pulse 78   Wt 120 lb (54.4 kg)   BMI 21.26 kg/m  Wt Readings from Last 5 Encounters:  04/12/18 120 lb (54.4 kg)  07/06/17 126 lb (57.2 kg)  05/17/17 125 lb (56.7 kg)  12/28/16 131 lb (59.4 kg)  08/19/16 126 lb (57.2 kg)    Gen: Well NAD HEENT: EOMI,  MMM Lungs: Normal work of breathing. CTABL Heart: RRR no MRG Abd: NABS, Soft. Nondistended, Nontender Exts: Brisk capillary refill, warm and well perfused.  Pulses intact distal extremities.   Lab and Radiology Results   Chemistry      Component Value Date/Time   NA 140 06/04/2017 0906   K 3.9 06/04/2017 0906   CL 101 06/04/2017 0906   CO2 32 06/04/2017 0906   BUN 12 06/04/2017 0906   CREATININE 1.38 (H) 06/04/2017 0906      Component Value Date/Time   CALCIUM 9.5 06/04/2017 0906   ALKPHOS 67 12/28/2016 1516   AST 18  06/04/2017 0906   ALT 17 06/04/2017 0906   BILITOT 0.5 06/04/2017 0906     Lab Results  Component Value Date   CHOL 151 06/04/2017   HDL 41 (L) 06/04/2017   LDLCALC 84 06/04/2017   LDLDIRECT 127 (H) 11/17/2007   TRIG 155 (H) 06/04/2017   CHOLHDL 3.7 06/04/2017   Lab Results  Component Value Date   HGBA1C 6.4 (A) 04/12/2018     Assessment and Plan: 77 y.o. female with  Hypertension: Doing well continue current regimen check metabolic panel  Hyperlipidemia: Doing well historically.  Tolerating medications recheck lipids.  Peripheral arterial disease: Doing well.  Would do better with smoking cessation however will continue current medical regimen including blood pressure control aspirin and statin.  Follow-up with vascular as previously planned.  I provided the phone number and address to her vascular doctor.  She will call and schedule an appointment.  Chronic kidney disease: Doing quite well continue current regimen.  Prediabetes: A1c stable today continue current regimen.  Smoking: Patient not interested in quitting at this time.  Recheck 6 months Medicare wellness visit.  Orders Placed This Encounter  Procedures  . CBC  . COMPLETE METABOLIC PANEL WITH GFR  . Lipid Panel w/reflex Direct LDL  . POCT HgB A1C   Meds ordered this encounter  Medications  . atorvastatin (LIPITOR) 40 MG tablet    Sig: TAKE 1 TABLET BY MOUTH EVERY DAY  Dispense:  90 tablet    Refill:  0  . losartan-hydrochlorothiazide (HYZAAR) 100-25 MG tablet    Sig: Take 1 tablet by mouth daily.    Dispense:  90 tablet    Refill:  0     Historical information moved to improve visibility of documentation.  Past Medical History:  Diagnosis Date  . Chronic kidney disease   . Hyperlipidemia   . Hypertension   . Prediabetes    Past Surgical History:  Procedure Laterality Date  . TUBAL LIGATION     Social History   Tobacco Use  . Smoking status: Current Every Day Smoker    Packs/day:  0.75    Years: 63.00    Pack years: 47.25    Types: Cigarettes  . Smokeless tobacco: Never Used  Substance Use Topics  . Alcohol use: No    Alcohol/week: 0.0 standard drinks   family history includes Heart attack (age of onset: 38) in her mother; Heart disease in her mother; Hypertension in her daughter, mother, sister, and son.  Medications: Current Outpatient Medications  Medication Sig Dispense Refill  . AMBULATORY NON FORMULARY MEDICATION Medication Name: Glucometer with lancets and strips. Dx diabetes, not on insulin therapy.  Test once a day. Box of 100. 1 Units 0  . aspirin 81 MG tablet Take 81 mg by mouth daily.    Marland Kitchen atorvastatin (LIPITOR) 40 MG tablet TAKE 1 TABLET BY MOUTH EVERY DAY 90 tablet 0  . losartan-hydrochlorothiazide (HYZAAR) 100-25 MG tablet Take 1 tablet by mouth daily. 90 tablet 0  . MULTIPLE VITAMIN PO Take by mouth.     No current facility-administered medications for this visit.    Allergies  Allergen Reactions  . Simvastatin Nausea And Vomiting    gerd gerd     Discussed warning signs or symptoms. Please see discharge instructions. Patient expresses understanding.

## 2018-04-13 ENCOUNTER — Telehealth: Payer: Self-pay | Admitting: Family Medicine

## 2018-04-13 MED ORDER — HYDROCHLOROTHIAZIDE 25 MG PO TABS: 25.0000 mg | ORAL_TABLET | Freq: Every day | ORAL | 1 refills | Status: DC

## 2018-04-13 MED ORDER — LOSARTAN POTASSIUM 100 MG PO TABS: 100.0000 mg | ORAL_TABLET | Freq: Every day | ORAL | 1 refills | Status: DC

## 2018-04-13 NOTE — Telephone Encounter (Signed)
Received fax from [facility].  The combination of losartan/hydrochlorothiazide is on back order.  I have prescribed these medications separately.  It will be 2 pills that are exactly the same medicine that you were previously taking however it is now 2 pills instead of 1.  You should be able to pick it up from the pharmacy.

## 2018-04-13 NOTE — Telephone Encounter (Signed)
Left VM for Pt to return clinic call.  

## 2018-04-15 NOTE — Telephone Encounter (Signed)
Spoke to patient gave her advise as noted below. She verbally understands. Rhonda Cunningham,CMA

## 2018-05-12 DIAGNOSIS — I739 Peripheral vascular disease, unspecified: Secondary | ICD-10-CM | POA: Diagnosis not present

## 2018-05-12 DIAGNOSIS — I701 Atherosclerosis of renal artery: Secondary | ICD-10-CM | POA: Diagnosis not present

## 2018-05-12 DIAGNOSIS — I6523 Occlusion and stenosis of bilateral carotid arteries: Secondary | ICD-10-CM | POA: Diagnosis not present

## 2018-06-10 DIAGNOSIS — I701 Atherosclerosis of renal artery: Secondary | ICD-10-CM | POA: Diagnosis not present

## 2018-06-10 DIAGNOSIS — I739 Peripheral vascular disease, unspecified: Secondary | ICD-10-CM | POA: Diagnosis not present

## 2018-06-10 DIAGNOSIS — Z720 Tobacco use: Secondary | ICD-10-CM | POA: Diagnosis not present

## 2018-06-10 DIAGNOSIS — I6523 Occlusion and stenosis of bilateral carotid arteries: Secondary | ICD-10-CM | POA: Diagnosis not present

## 2018-07-07 ENCOUNTER — Other Ambulatory Visit: Payer: Self-pay | Admitting: Family Medicine

## 2018-07-07 DIAGNOSIS — E782 Mixed hyperlipidemia: Secondary | ICD-10-CM

## 2018-07-15 DIAGNOSIS — Z720 Tobacco use: Secondary | ICD-10-CM | POA: Diagnosis not present

## 2018-07-15 DIAGNOSIS — I6523 Occlusion and stenosis of bilateral carotid arteries: Secondary | ICD-10-CM | POA: Diagnosis not present

## 2018-09-12 ENCOUNTER — Other Ambulatory Visit: Payer: Self-pay | Admitting: Family Medicine

## 2018-09-12 DIAGNOSIS — E782 Mixed hyperlipidemia: Secondary | ICD-10-CM

## 2018-09-13 ENCOUNTER — Telehealth: Payer: Self-pay | Admitting: Family Medicine

## 2018-09-13 ENCOUNTER — Ambulatory Visit (INDEPENDENT_AMBULATORY_CARE_PROVIDER_SITE_OTHER): Payer: Medicare HMO | Admitting: Family Medicine

## 2018-09-13 ENCOUNTER — Encounter: Payer: Self-pay | Admitting: Family Medicine

## 2018-09-13 DIAGNOSIS — N183 Chronic kidney disease, stage 3 unspecified: Secondary | ICD-10-CM

## 2018-09-13 DIAGNOSIS — E782 Mixed hyperlipidemia: Secondary | ICD-10-CM

## 2018-09-13 DIAGNOSIS — I129 Hypertensive chronic kidney disease with stage 1 through stage 4 chronic kidney disease, or unspecified chronic kidney disease: Secondary | ICD-10-CM | POA: Diagnosis not present

## 2018-09-13 DIAGNOSIS — R7303 Prediabetes: Secondary | ICD-10-CM | POA: Diagnosis not present

## 2018-09-13 DIAGNOSIS — I10 Essential (primary) hypertension: Secondary | ICD-10-CM

## 2018-09-13 MED ORDER — LOSARTAN POTASSIUM 100 MG PO TABS: 100.0000 mg | ORAL_TABLET | Freq: Every day | ORAL | 1 refills | Status: DC

## 2018-09-13 MED ORDER — HYDROCHLOROTHIAZIDE 25 MG PO TABS: 25.0000 mg | ORAL_TABLET | Freq: Every day | ORAL | 1 refills | Status: DC

## 2018-09-13 MED ORDER — ATORVASTATIN CALCIUM 40 MG PO TABS: 40.0000 mg | ORAL_TABLET | Freq: Every day | ORAL | 1 refills | Status: DC

## 2018-09-13 NOTE — Telephone Encounter (Signed)
Left message with information below and for patient to call us back to schedule an appointment. °

## 2018-09-13 NOTE — Progress Notes (Signed)
Virtual Visit  I connected with      Cassidy Jennings  by a telemedicine application and verified that I am speaking with the correct person using two identifiers.   I discussed the limitations of evaluation and management by telemedicine and the availability of in person appointments. The patient expressed understanding and agreed to proceed.  History of Present Illness: Cassidy Jennings is a 78 y.o. female who would like to discuss hypertension, CKD, vascular disease    Carotid artery disease.  Patient has ongoing carotid artery disease as part of her overall vascular pathology.  She had annual surveillance in January which showed worsening of the carotid artery stenosis.  Fortunately she remains asymptomatic.  She had a discussion with a vascular surgeon on March 6 at [facility].  She wanted to avoid carotid endarterectomy at that time.  She wanted proceed with medication management including aspirin Plavix blood pressure and cholesterol control, and smoking cessation.   Hypertension: Takes medications listed below.  No chest pain palpitation shortness of breath.  She notes that she did longer has a home blood pressure monitor but would like to get one.  During her last office visit with vascular surgery in March her blood pressure was 122/70.   Hyperlipidemia: Tolerating atorvastatin well with no issues.  No significant muscle aches or pains.  CKD 3: Patient was last seen for labs December 2019.  During that time her creatinine increased and she was advised to follow back up with her nephrologist.  She has not done this yet and denies any significant change in health.  Observations/Objective: Vitals from March 2020 with vascular surgery were blood pressure 122/70, weight 123 pounds Wt Readings from Last 5 Encounters:  04/12/18 120 lb (54.4 kg)  07/06/17 126 lb (57.2 kg)  05/17/17 125 lb (56.7 kg)  12/28/16 131 lb (59.4 kg)  08/19/16 126 lb (57.2 kg)   Exam: Normal Speech.     Lab and Radiology Results   Chemistry      Component Value Date/Time   NA 139 04/12/2018 1100   K 3.7 04/12/2018 1100   CL 101 04/12/2018 1100   CO2 32 04/12/2018 1100   BUN 17 04/12/2018 1100   CREATININE 1.73 (H) 04/12/2018 1100      Component Value Date/Time   CALCIUM 9.6 04/12/2018 1100   ALKPHOS 67 12/28/2016 1516   AST 18 04/12/2018 1100   ALT 14 04/12/2018 1100   BILITOT 0.6 04/12/2018 1100     Lab Results  Component Value Date   CHOL 165 04/12/2018   HDL 39 (L) 04/12/2018   LDLCALC 99 04/12/2018   LDLDIRECT 127 (H) 11/17/2007   TRIG 167 (H) 04/12/2018   CHOLHDL 4.2 04/12/2018    No results found. CAROTID DUP-BILAT 05/12/2018   Interface, External Ris In - 05/12/2018 12:26 PM EST Procedure: This exam consists of 2D gray scale imaging, color Doppler, and pulsed wave Doppler analysis of the carotid arteries. Previous: Previous exam performed on 04/26/17 demonstrated a 60-79% left ICA stenosis with maximum velocities of 409 cm/s PSV/118 cm/s EDV. Right: Moderate focal increase in ICA velocities with post stenotic turbulence. Left: Significant focal increase in ICA velocities with post stenotic turbulence.  Additional information: ICA stenosis is at the origin/proximal segment. Velocities normalize distal to the stenosis. No significant proximal disease in the CCA. Distal ICA is without significant disease. No significant tortuosity encountered. Conclusions: RIGHT: Moderate ICA stenosis, consistent with 60-79%. LEFT: Significant ICA stenosis, consistent with 80-99%. Progression  of stenoses bilaterally since previous exam.   ABIS WITH TPS 05/12/2018  Procedure: Examination consists of physiologic resting arterial pressures of the brachial and ankle arteries bilaterally with continuous wave Doppler waveform analysis. Previous: Previous exam performed on 04/26/17 demonstrated ABI's of Rt = 0.65 and Lt = 0.70, great toe pressures of Rt = 39 mmHg and Lt = 77  mmHg. Right: ABI =  0.71, 1st digit pressure = 60 mmHg Left: ABI =  0.77, 1st digit pressure = 82 mmHg Conclusions: Right: ABI (0.41-0.9) indicates mild to moderate peripheral arterial disease.  Compared to the previous study, ABI is essentially unchanged. Left:  ABI (0.41-0.9) indicates mild to moderate peripheral arterial disease.  Compared to the previous study, ABI is essentially unchanged.   RENAL ARTERY DUP BILAT1/06/2018   Procedure: This exam consists of 2D gray scale imaging, color Doppler, and pulsed wave Doppler analysis of the abdominal aorta, kidneys, renal arteries and renal veins. Previous: Previous exam performed on 04/26/17 demonstrated a greater than 60% right renal artery stenosis with maximum velocities of 278 cm/sec. Bilateral: Cannot exclude accessory renal arteries.  Kidney size measurements may be inaccurate due to presence of renal cysts. Right: Focal increase in renal artery velocities with turbulent flow noted distally.  Multiple anechoic, non-vascular round masses are noted in the kidney the largest measuring  5.6 cm. Left: No significant focal increase in renal artery velocities or distal turbulence is noted.  Midline view of the left renal artery is obscured by overlying bowel/gas.  Multiple anechoic, non-vascular round masses are noted in the kidney the largest  measuring 7.0 cm. 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   Left:  No evidence of significant renal artery stenosis, less than 60%.     Multiple renal cysts are noted bilaterally.  These results remain essentially unchanged compared to previous exam       Assessment and Plan: 78 y.o. female with  Vascular disease: Slowly worsening but fortunately asymptomatic.  Medical management is reasonable along with good blood pressure control, good cholesterol control and antiplatelets.  Plan to recheck labs as below continue to work on smoking cessation and reassess in about 3  months.  Hypertension: Typically well controlled.  Plan to recheck basic labs below and refill medications.  Additionally recommend home blood pressure automatic cuff for home blood pressure monitoring.  Hyperlipidemia: Also doing reasonably well with atorvastatin.  Plan to recheck lipid panel.  CKD: Slightly worsening with last check.  Patient never did follow-up with nephrology.  At this point I think is reasonable just to check labs again and reassess.  If creatinine is stable then we will continue to follow if creatinine has worsened will more urgently refer to nephrology.  Additionally patient has a history of prediabetes with A1c 6.4 on last check.  We will recheck A1c as well with labs in near future.   Orders Placed This Encounter  Procedures  . CBC  . COMPLETE METABOLIC PANEL WITH GFR  . Hemoglobin A1c   Meds ordered this encounter  Medications  . hydrochlorothiazide (HYDRODIURIL) 25 MG tablet    Sig: Take 1 tablet (25 mg total) by mouth daily.    Dispense:  90 tablet    Refill:  1  . losartan (COZAAR) 100 MG tablet    Sig: Take 1 tablet (100 mg total) by mouth daily.    Dispense:  90 tablet    Refill:  1  . atorvastatin (LIPITOR) 40 MG tablet    Sig: Take  1 tablet (40 mg total) by mouth daily.    Dispense:  90 tablet    Refill:  1    Follow Up Instructions:    I discussed the assessment and treatment plan with the patient. The patient was provided an opportunity to ask questions and all were answered. The patient agreed with the plan and demonstrated an understanding of the instructions.   The patient was advised to call back or seek an in-person evaluation if the symptoms worsen or if the condition fails to improve as anticipated.  Time: 25 minutes of intraservice time, with >39 minutes of total time during today's visit.      Historical information moved to improve visibility of documentation.  Past Medical History:  Diagnosis Date  . Chronic kidney disease    . Hyperlipidemia   . Hypertension   . Prediabetes    Past Surgical History:  Procedure Laterality Date  . TUBAL LIGATION     Social History   Tobacco Use  . Smoking status: Current Every Day Smoker    Packs/day: 0.75    Years: 63.00    Pack years: 47.25    Types: Cigarettes  . Smokeless tobacco: Never Used  Substance Use Topics  . Alcohol use: No    Alcohol/week: 0.0 standard drinks   family history includes Heart attack (age of onset: 15) in her mother; Heart disease in her mother; Hypertension in her daughter, mother, sister, and son.  Medications: Current Outpatient Medications  Medication Sig Dispense Refill  . aspirin 81 MG tablet Take 81 mg by mouth daily.    Marland Kitchen atorvastatin (LIPITOR) 40 MG tablet Take 1 tablet (40 mg total) by mouth daily. 90 tablet 1  . Cholecalciferol (VITAMIN D3) 50 MCG (2000 UT) capsule Take by mouth.    . clopidogrel (PLAVIX) 75 MG tablet Take 1 tablet by mouth daily.    . hydrochlorothiazide (HYDRODIURIL) 25 MG tablet Take 1 tablet (25 mg total) by mouth daily. 90 tablet 1  . losartan (COZAAR) 100 MG tablet Take 1 tablet (100 mg total) by mouth daily. 90 tablet 1  . MULTIPLE VITAMIN PO Take by mouth.    . AMBULATORY NON FORMULARY MEDICATION Medication Name: Glucometer with lancets and strips. Dx diabetes, not on insulin therapy.  Test once a day. Box of 100. 1 Units 0   No current facility-administered medications for this visit.    Allergies  Allergen Reactions  . Simvastatin Nausea And Vomiting    gerd gerd

## 2018-09-13 NOTE — Patient Instructions (Addendum)
Thank you for coming in today. Get your labs soon.  Continue medicine.  Recheck with me in 3 months via video or phone.   Try to get a monitor to check your blood pressure.

## 2018-09-13 NOTE — Telephone Encounter (Signed)
-----   Message from Gregor Hams, MD sent at 09/13/2018  9:09 AM EDT ----- Regarding: 3 month recheck Please call and schedule 3 month recheck HTN, CKD video visit

## 2018-10-11 ENCOUNTER — Ambulatory Visit: Payer: Medicare HMO

## 2018-10-16 NOTE — Progress Notes (Signed)
Subjective:   Cassidy Jennings is a 78 y.o. female who presents for Medicare Annual (Subsequent) preventive examination.  Review of Systems:  No ROS.  Medicare Wellness Virtual Visit.  Visual/audio telehealth visit, UTA vital signs.   See social history for additional risk factors.    Cardiac Risk Factors include: advanced age (>58men, >90 women);hypertension;smoking/ tobacco exposure;sedentary lifestyle Sleep patterns:Getting 8 hours of sleep a night. Wakes up 2 times to void. Wakes up and feels refreshed. Home Safety/Smoke Alarms: Feels safe in home. Smoke alarms in place.  Living environment; Lives alone in apartment. Shower is a step over shower and grab bars in place. Seat Belt Safety/Bike Helmet: Wears seat belt.   Female:   Pap-  Aged out    Mammo- Aged out unless necessary      Dexa scan-   UTD     CCS- Aged out     Objective:     Vitals: There were no vitals taken for this visit.  There is no height or weight on file to calculate BMI.  Advanced Directives 10/17/2018 02/11/2015  Does Patient Have a Medical Advance Directive? No Yes  Type of Advance Directive - [place];Living will  Does patient want to make changes to medical advance directive? - No - Patient declined  Would patient like information on creating a medical advance directive? No - Patient declined -    Tobacco Social History   Tobacco Use  Smoking Status Current Every Day Smoker  . Packs/day: 0.25  . Years: 63.00  . Pack years: 15.75  . Types: Cigarettes  Smokeless Tobacco Never Used     Ready to quit: Not Answered Counseling given: Not Answered   Clinical Intake:  Pre-visit preparation completed: Yes  Pain : No/denies pain     Nutritional Risks: None Diabetes: No  How often do you need to have someone help you when you read instructions, pamphlets, or other written materials from your doctor or pharmacy?: 1 - Never What is the last grade level you completed in  school?: 12  Interpreter Needed?: No  Information entered by :: Orlie Dakin, LPN  Past Medical History:  Diagnosis Date  . Chronic kidney disease   . Hyperlipidemia   . Hypertension   . Prediabetes    Past Surgical History:  Procedure Laterality Date  . TUBAL LIGATION     Family History  Problem Relation Age of Onset  . Hypertension Mother   . Heart attack Mother 25  . Heart disease Mother   . Hypertension Sister   . Hypertension Daughter   . Hypertension Son    Social History   Socioeconomic History  . Marital status: Widowed    Spouse name: Not on file  . Number of children: 6  . Years of education: 28  . Highest education level: 12th grade  Occupational History  . 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 Financial resource strain: Not hard at all  . Food insecurity:    Worry: Never true    Inability: Never true  . Transportation needs:    Medical: No    Non-medical: No  Tobacco Use  . Smoking status: Current Every Day Smoker    Packs/day: 0.25    Years: 63.00    Pack years: 15.75    Types: Cigarettes  . Smokeless tobacco: Never Used  Substance and Sexual Activity  . Alcohol use: No    Alcohol/week: 0.0 standard drinks  .  Drug use: No  . Sexual activity: Not Currently  Lifestyle  . Physical activity:    Days per week: 3 days    Minutes per session: 10 min  . Stress: Not at all  Relationships  . Social connections:    Talks on phone: More than three times a week    Gets together: Twice a week    Attends religious service: 1 to 4 times per year    Active member of club or organization: No    Attends meetings of clubs or organizations: Never    Relationship status: Widowed  Other Topics Concern  . Not on file  Social History Narrative   She smokes but has multiple family members that smoke. Stays active but no regular exercise. Walks to mail box and back daily is how she gets her exercise.    Outpatient Encounter  Medications as of 10/17/2018  Medication Sig  . AMBULATORY NON FORMULARY MEDICATION Medication Name: Glucometer with lancets and strips. Dx diabetes, not on insulin therapy.  Test once a day. Box of 100.  Marland Kitchen aspirin 81 MG tablet Take 81 mg by mouth daily.  Marland Kitchen atorvastatin (LIPITOR) 40 MG tablet Take 1 tablet (40 mg total) by mouth daily.  . Cholecalciferol (VITAMIN D3) 50 MCG (2000 UT) capsule Take by mouth.  . clopidogrel (PLAVIX) 75 MG tablet Take 1 tablet by mouth daily.  . hydrochlorothiazide (HYDRODIURIL) 25 MG tablet Take 1 tablet (25 mg total) by mouth daily.  Marland Kitchen losartan (COZAAR) 100 MG tablet Take 1 tablet (100 mg total) by mouth daily.  . MULTIPLE VITAMIN PO Take by mouth.   No facility-administered encounter medications on file as of 10/17/2018.     Activities of Daily Living In your present state of health, do you have any difficulty performing the following activities: 10/17/2018  Hearing? N  Vision? N  Difficulty concentrating or making decisions? N  Walking or climbing stairs? N  Dressing or bathing? N  Doing errands, shopping? N  Preparing Food and eating ? N  Using the Toilet? N  In the past six months, have you accidently leaked urine? N  Do you have problems with loss of bowel control? N  Managing your Medications? N  Managing your Finances? N  Housekeeping or managing your Housekeeping? N  Some recent data might be hidden    Patient Care Team: Gregor Hams, MD as PCP - General (Family Medicine)    Assessment:   This is a routine wellness examination for Avangelina.Physical assessment deferred to PCP.   Exercise Activities and Dietary recommendations Current Exercise Habits: Home exercise routine, Type of exercise: walking, Time (Minutes): 30, Frequency (Times/Week): 3, Weekly Exercise (Minutes/Week): 90, Intensity: Mild, Exercise limited by: None identified Diet Eats vegetables, meats, gets milk in her cereal. Breakfast: cereal with banana Lunch: snack-peanut and  jelly sandwich Dinner:  Meat, green beans, corn   Drinks water daily. No caffiene use  Goals    . Patient Stated     Would like to quit smoking this year       Fall Risk Fall Risk  10/17/2018 04/12/2018 08/05/2016 01/10/2016  Falls in the past year? 0 0 No No  Comment - - - Emmi Telephone Survey: data to providers prior to load  Number falls in past yr: - 0 - -  Injury with Fall? - 0 - -  Follow up Falls prevention discussed Education provided;Falls prevention discussed - -   Is the patient's home free of loose throw  rugs in walkways, pet beds, electrical cords, etc?   yes      Grab bars in the bathroom? yes      Handrails on the stairs?   yes      Adequate lighting?   yes   Depression Screen PHQ 2/9 Scores 10/17/2018 09/13/2018 04/12/2018 12/28/2016  PHQ - 2 Score 0 0 0 0     Cognitive Function     6CIT Screen 10/17/2018  What Year? 0 points  What month? 0 points  What time? 0 points  Count back from 20 0 points  Months in reverse 4 points  Repeat phrase 0 points  Total Score 4    Immunization History  Administered Date(s) Administered  . Influenza Split 03/22/2011, 03/31/2012  . Influenza Whole 03/19/2009, 04/17/2010  . Influenza,inj,Quad PF,6+ Mos 01/31/2015, 01/13/2017  . Influenza-Unspecified 05/12/2015, 03/11/2018  . Pneumococcal Conjugate-13 01/31/2015  . Pneumococcal Polysaccharide-23 04/21/2011  . Tdap 04/21/2011  . Zoster 04/10/2012    Screening Tests Health Maintenance  Topic Date Due  . INFLUENZA VACCINE  12/10/2018  . TETANUS/TDAP  04/20/2021  . DEXA SCAN  Completed  . PNA vac Low Risk Adult  Completed        Plan:    Please schedule your next medicare wellness visit with me in 1 yr.  Ms. Brashear , Thank you for taking time to come for your Medicare Wellness Visit. I appreciate your ongoing commitment to your health goals. Please review the following plan we discussed and let me know if I can assist you in the future.  Continue doing brain  stimulating activities (puzzles, reading, adult coloring books, staying active) to keep memory sharp.    These are the goals we discussed: Goals    . Patient Stated     Would like to quit smoking this year       This is a list of the screening recommended for you and due dates:  Health Maintenance  Topic Date Due  . Flu Shot  12/10/2018  . Tetanus Vaccine  04/20/2021  . DEXA scan (bone density measurement)  Completed  . Pneumonia vaccines  Completed      I have personally reviewed and noted the following in the patient's chart:   . Medical and social history . Use of alcohol, tobacco or illicit drugs  . Current medications and supplements . Functional ability and status . Nutritional status . Physical activity . Advanced directives . List of other physicians . Hospitalizations, surgeries, and ER visits in previous 12 months . Vitals . Screenings to include cognitive, depression, and falls . Referrals and appointments  In addition, I have reviewed and discussed with patient certain preventive protocols, quality metrics, and best practice recommendations. A written personalized care plan for preventive services as well as general preventive health recommendations were provided to patient.     Joanne Chars, LPN  3/0/0762

## 2018-10-17 ENCOUNTER — Ambulatory Visit (INDEPENDENT_AMBULATORY_CARE_PROVIDER_SITE_OTHER): Payer: Medicare HMO | Admitting: *Deleted

## 2018-10-17 DIAGNOSIS — Z0000 Encounter for general adult medical examination without abnormal findings: Secondary | ICD-10-CM | POA: Diagnosis not present

## 2018-10-17 NOTE — Patient Instructions (Signed)
Please schedule your next medicare wellness visit with me in 1 yr.  Ms. Dubow , Thank you for taking time to come for your Medicare Wellness Visit. I appreciate your ongoing commitment to your health goals. Please review the following plan we discussed and let me know if I can assist you in the future.  Continue doing brain stimulating activities (puzzles, reading, adult coloring books, staying active) to keep memory sharp.   These are the goals we discussed: Goals    . Patient Stated     Would like to quit smoking this year

## 2019-01-03 ENCOUNTER — Other Ambulatory Visit: Payer: Self-pay | Admitting: Family Medicine

## 2019-01-03 DIAGNOSIS — E782 Mixed hyperlipidemia: Secondary | ICD-10-CM

## 2019-01-11 ENCOUNTER — Other Ambulatory Visit: Payer: Self-pay | Admitting: Family Medicine

## 2019-03-30 DIAGNOSIS — R69 Illness, unspecified: Secondary | ICD-10-CM | POA: Diagnosis not present

## 2019-04-12 ENCOUNTER — Other Ambulatory Visit: Payer: Self-pay

## 2019-04-12 ENCOUNTER — Emergency Department (INDEPENDENT_AMBULATORY_CARE_PROVIDER_SITE_OTHER)
Admission: EM | Admit: 2019-04-12 | Discharge: 2019-04-12 | Disposition: A | Discharge status: Home / Self Care | Payer: Medicare HMO | Source: Home / Self Care

## 2019-04-12 ENCOUNTER — Encounter: Payer: Self-pay | Admitting: Emergency Medicine

## 2019-04-12 DIAGNOSIS — Z20822 Contact with and (suspected) exposure to covid-19: Secondary | ICD-10-CM

## 2019-04-12 DIAGNOSIS — Z20828 Contact with and (suspected) exposure to other viral communicable diseases: Secondary | ICD-10-CM

## 2019-04-12 NOTE — ED Triage Notes (Signed)
COVID TEST

## 2019-04-13 NOTE — ED Provider Notes (Signed)
Cassidy [facility] CARE    CSN: [id number] Arrival date & time: 04/12/19  1021      History   Chief Complaint Chief Complaint  Patient presents with  . COVID TEST    HPI Cassidy Jennings is a 78 y.o. female.   Patient request Covid test she states she is feeling well.  No fever no chills patient just wants to make sure that she is okay  The history is provided by the patient. No language interpreter was used.    Past Medical History:  Diagnosis Date  . Chronic kidney disease   . Hyperlipidemia   . Hypertension   . Prediabetes     Patient Active Problem List   Diagnosis Date Noted  . Renal artery stenosis ([place]) 12/29/2016  . Osteopenia 09/23/2016  . Post-menopausal 08/05/2016  . PAD (peripheral artery disease) ([place]) 01/11/2015  . CKD (chronic kidney disease) stage 3, GFR 30-59 ml/min 01/01/2015  . Carotid artery disease ([place]) 08/01/2014  . Tobacco abuse 07/13/2014  . Prediabetes 09/12/2012  . Cataract 05/10/2012  . Glaucoma 05/10/2012  . Hypertension   . Hyperlipidemia   . MEMORY LOSS 08/09/2009  . PAP SMEAR, ABNORMAL 03/21/2009  . GERD 03/19/2009  . Hearing loss 11/07/2007    Past Surgical History:  Procedure Laterality Date  . TUBAL LIGATION      OB History   No obstetric history on file.      Home Medications    Prior to Admission medications   Medication Sig Start Date End Date Taking? Authorizing Provider  AMBULATORY NON FORMULARY MEDICATION Medication Name: Glucometer with lancets and strips. Dx diabetes, not on insulin therapy.  Test once a day. Box of 100. 07/13/14   Hali Marry, MD  aspirin 81 MG tablet Take 81 mg by mouth daily.    [provider]  atorvastatin (LIPITOR) 40 MG tablet TAKE 1 TABLET BY MOUTH DAILY 01/03/19   Gregor Hams, MD  Cholecalciferol (VITAMIN D3) 50 MCG (2000 UT) capsule Take by mouth.    [provider]  clopidogrel (PLAVIX) 75 MG tablet Take 1 tablet by mouth daily. 09/12/18   [provider]  hydrochlorothiazide (HYDRODIURIL) 25 MG tablet TAKE 1 TABLET BY MOUTH DAILY 01/03/19   Gregor Hams, MD  losartan (COZAAR) 100 MG tablet TAKE 1 TABLET BY MOUTH DAILY 01/12/19   Gregor Hams, MD  MULTIPLE VITAMIN PO Take by mouth.    [provider]    Family History Family History  Problem Relation Age of Onset  . Hypertension Mother   . Heart attack Mother 60  . Heart disease Mother   . Hypertension Sister   . Hypertension Daughter   . Hypertension Son     Social History Social History   Tobacco Use  . Smoking status: Current Every Day Smoker    Packs/day: 0.25    Years: 63.00    Pack years: 15.75    Types: Cigarettes  . Smokeless tobacco: Never Used  Substance Use Topics  . Alcohol use: No    Alcohol/week: 0.0 standard drinks  . Drug use: No     Allergies   Simvastatin   Review of Systems Review of Systems  All other systems reviewed and are negative.    Physical Exam Triage Vital Signs ED Triage Vitals  Enc Vitals Group     BP 04/12/19 1044 (!) 164/71     Pulse Rate 04/12/19 1044 92     Resp --  Temp 04/12/19 1044 97.7 F (36.5 C)     Temp Source 04/12/19 1044 Oral     SpO2 04/12/19 1044 99 %     Weight 04/12/19 1045 125 lb (56.7 kg)     Height 04/12/19 1045 5\' 4"  (1.626 m)     Head Circumference --      Peak Flow --      Pain Score 04/12/19 1045 0     Pain Loc --      Pain Edu? --      Excl. in [place]? --    No data found.  Updated Vital Signs BP (!) 164/71 (BP Location: Right Arm)   Pulse 92   Temp 97.7 F (36.5 C) (Oral)   Ht 5\' 4"  (1.626 m)   Wt 56.7 kg   SpO2 99%   BMI 21.46 kg/m   Visual Acuity Right Eye Distance:   Left Eye Distance:   Bilateral Distance:    Right Eye Near:   Left Eye Near:    Bilateral Near:     Physical Exam Vitals signs and nursing note reviewed.  Constitutional:      Appearance: She is well-developed.  HENT:     Head: Normocephalic.  Neck:     Musculoskeletal: Normal  range of motion.  Cardiovascular:     Rate and Rhythm: Normal rate.  Pulmonary:     Effort: Pulmonary effort is normal.  Abdominal:     General: There is no distension.  Musculoskeletal: Normal range of motion.  Neurological:     Mental Status: She is alert and oriented to person, place, and time.      UC Treatments / Results  Labs (all labs ordered are listed, but only abnormal results are displayed) Labs Reviewed  SARS-COV-2 RNA,(COVID-19) QUALITATIVE NAAT    EKG   Radiology No results found.  Procedures Procedures (including critical care time)  Medications Ordered in UC Medications - No data to display  Initial Impression / Assessment and Plan / UC Course  I have reviewed the triage vital signs and the nursing notes.  Pertinent labs & imaging results that were available during my care of the patient were reviewed by me and considered in my medical decision making (see chart for details).     MDM quest Covid ordered.  Patient counseled on COVID-19 and quarantining Final Clinical Impressions(s) / UC Diagnoses   Final diagnoses:  Encounter for laboratory testing for COVID-19 virus   Discharge Instructions   None    ED Prescriptions    None     PDMP not reviewed this encounter.  An After Visit Summary was printed and given to the patient.    Fransico Meadow, [place] 04/13/19 [phone]

## 2019-04-15 ENCOUNTER — Telehealth: Payer: Self-pay | Admitting: Emergency Medicine

## 2019-04-15 LAB — SARS-COV-2 RNA,(COVID-19) QUALITATIVE NAAT: SARS CoV2 RNA: NOT DETECTED

## 2019-04-15 NOTE — Telephone Encounter (Signed)
LVM that covid test was negative; to call with questions/concerns.

## 2019-04-20 ENCOUNTER — Telehealth: Payer: Self-pay | Admitting: Emergency Medicine

## 2019-04-20 NOTE — Telephone Encounter (Signed)
Called asking for covid results. Ned result given.

## 2019-05-16 ENCOUNTER — Encounter: Payer: Self-pay | Admitting: Sports Medicine

## 2019-05-16 ENCOUNTER — Ambulatory Visit (INDEPENDENT_AMBULATORY_CARE_PROVIDER_SITE_OTHER): Payer: Medicare HMO

## 2019-05-16 ENCOUNTER — Other Ambulatory Visit: Payer: Self-pay

## 2019-05-16 ENCOUNTER — Ambulatory Visit (INDEPENDENT_AMBULATORY_CARE_PROVIDER_SITE_OTHER): Payer: Medicare HMO | Admitting: Sports Medicine

## 2019-05-16 DIAGNOSIS — F0280 Dementia in other diseases classified elsewhere without behavioral disturbance: Secondary | ICD-10-CM

## 2019-05-16 DIAGNOSIS — R69 Illness, unspecified: Secondary | ICD-10-CM | POA: Diagnosis not present

## 2019-05-16 DIAGNOSIS — G301 Alzheimer's disease with late onset: Secondary | ICD-10-CM | POA: Diagnosis not present

## 2019-05-16 DIAGNOSIS — G309 Alzheimer's disease, unspecified: Secondary | ICD-10-CM | POA: Diagnosis not present

## 2019-05-16 DIAGNOSIS — G300 Alzheimer's disease with early onset: Secondary | ICD-10-CM | POA: Diagnosis not present

## 2019-05-16 MED ORDER — DONEPEZIL HCL 5 MG PO TABS: 5.0000 mg | ORAL_TABLET | Freq: Every evening | ORAL | 3 refills | Status: DC | PRN

## 2019-05-16 NOTE — Progress Notes (Signed)
    Procedures performed today:    None.  Independent interpretation of tests performed by another provider:   None.  Impression and Recommendations:    Alzheimer's dementia ([place]) Over the past few years while his family has noted increased forgetfulness, repeating things that she has had before, she is not wandering and she is not leaving the stove on. Abnormalities were noted on her Medicare exam, and she is here for further evaluation. She did have an abnormally low MMSE score. She has no symptoms of depression. I am going to do a serum dementia work-up and a brain MRI. Were also going to start Aricept, return in 1 month for repeat MMSE. She does understand that she will need to establish care with one of the other providers, I am happy to help with the management of this but I am not going to be her regular PCP.    ___________________________________________ Gwen Her. Dianah Field, M.D., ABFM., CAQSM. Primary Care and [place] Instructor of [place] of [facility] of Medicine

## 2019-05-16 NOTE — Assessment & Plan Note (Addendum)
Over the past few years while his family has noted increased forgetfulness, repeating things that she has had before, she is not wandering and she is not leaving the stove on. Abnormalities were noted on her Medicare exam, and she is here for further evaluation. She did have an abnormally low MMSE score. She has no symptoms of depression. I am going to do a serum dementia work-up and a brain MRI. Were also going to start Aricept, return in 1 month for repeat MMSE. She does understand that she will need to establish care with one of the other providers, I am happy to help with the management of this but I am not going to be her regular PCP.

## 2019-05-16 NOTE — Patient Instructions (Signed)
Alzheimer Disease Caregiver Guide  Alzheimer disease causes a person to lose the ability to remember things and make decisions. A person who has Alzheimer disease may not be able to take care of himself or herself. He or she may need help with simple tasks. The tips below can help you care for the person. What kind of changes does this condition cause? This condition makes a person:  Forget things.  Feel confused.  Act differently.  Have different moods. These things get worse with time. Tips to help with symptoms  Be calm and patient.  Respond with a simple, short answer.  Avoid correcting the person in a negative way.  Try not to take things personally, even if the person forgets your name.  Do not argue with the person. This may make the person more upset. Tips to lessen frustration  Make appointments and do daily tasks when the person is at his or her best.  Take your time. Simple tasks may take longer. Allow plenty of time to complete tasks.  Limit choices for the person.  Involve the person in what you are doing.  Keep a daily routine.  Avoid new or crowded places, if possible.  Use simple words, short sentences, and a calm voice. Only give one direction at a time.  Buy clothes and shoes that are easy to put on and take off.  Organize medicines in a pillbox for each day of the week.  Keep a calendar in a central location to remind the person of meetings or other activities.  Let people help if they offer. Take a break when needed. Tips to prevent injury  Keep floors clear. Remove rugs, magazine racks, and floor lamps.  Keep hallways well-lit.  Put a handrail and non-slip mat in the bathtub or shower.  Put childproof locks on cabinets that have dangerous items in them. These items include medicine, alcohol, guns, toxic cleaning items, sharp tools, matches, and lighters.  Put locks on doors where the person cannot see or reach them. This helps the person  to not wander out of the house and get lost.  Be prepared for emergencies. Keep a list of emergency phone numbers and addresses close by.  Bracelets may be worn that track location and identify the person as having memory problems. This should be worn at all times for safety. Tips for the future  Discuss financial and legal planning early. People with this disease have trouble managing their money as the disease gets worse. Get help from a professional.  Talk about advance directives, safety, and daily care. Take these steps: ? Create a living will and choose a power of attorney. This is someone who can make decisions for the person with Alzheimer disease when he or she can no longer do so. ? Discuss driving safety and when to stop driving. The person's doctor can help with this. ? If the person lives alone, make sure he or she is safe. Some people need extra help at home. Other people need more care at a nursing home or care center. Where to find support You can find support by joining a support group near you. Some benefits of joining a support group include:  Learning ways to manage stress.  Sharing experiences with others.  Getting emotional comfort and support.  Learning about caregiving as the disease progresses.  Knowing what community resources are available and making use of them. Where to find more information  Alzheimer's Association: [employer] Contact a doctor if:  The person has a fever.  The person has a sudden behavior change that does not get better with calming strategies.  The person is not able to take care of himself or herself at home.  The person threatens you or anyone else, including himself or herself.  You are no longer able to care for the person. Summary  Alzheimer disease causes a person to forget things and to be confused.  A person who has this condition may not be able to take care of himself or herself.  Take steps to keep the person  from getting hurt. Plan for future care.  You can find support by joining a support group near you. This information is not intended to replace advice given to you by your health care provider. Make sure you discuss any questions you have with your health care provider. Document Revised: 08/16/2018 Document Reviewed: 04/22/2017 Elsevier Patient Education  [place]. Alzheimer's Disease Alzheimer's disease is a brain disease that affects memory, thinking, language, and behavior. People with Alzheimer's disease lose mental abilities, and the disease gets worse over time. Alzheimer's disease is a form of dementia. What are the causes? This condition develops when a protein called beta-amyloid forms deposits in the brain. It is not known what causes these deposits to form. Alzheimer's disease may also be caused by a gene mutation that is inherited from one parent or both parents. A gene mutation is a harmful change in a gene. Not everyone who inherits the genetic mutation will get the disease. What increases the risk? You are more likely to develop this condition if you:  Are older than age 20.  Have a family history of dementia.  Have had a brain injury.  Have heart or blood vessel disease.  Have had a stroke.  Have high blood pressure or high cholesterol.  Have diabetes. What are the signs or symptoms? Symptoms of this condition may happen in three stages, which often overlap. Early stage In this stage, you may continue to be independent. You may still be able to drive, work, and be social. Symptoms in this stage include:  Minor memory problems, such as forgetting a name or what you read.  Difficulty with: ? Paying attention. ? Communicating. ? Doing familiar tasks. ? Problem solving or doing calculations. ? Following instructions. ? Learning new things.  Anxiety.  Social withdrawal.  Loss of motivation. Moderate stage In this stage, you will start to need care.  Symptoms in this stage include:  Difficulty with expressing thoughts.  Memory loss that affects daily life. This can include forgetting: ? Your address or phone number. ? Recent events that have happened. ? Parts of your personal history, such as where you went to school.  Confusion about where you are or what time it is.  Difficulty in judging distance.  Changes in personality, mood, and behavior. You may be moody, irritable, angry, frustrated, fearful, anxious, or suspicious.  Poor reasoning and judgment.  Delusions or hallucinations.  Changes in sleep patterns.  Wandering and getting lost, even in familiar places. Severe stage In the final stage, you will need help with your personal care and daily activities. Symptoms in this stage include:  Worsening memory loss.  Personality changes.  Loss of awareness of your surroundings.  Changes in physical abilities, including the ability to walk, sit, and swallow.  Difficulty in communicating.  Inability to control your bladder and bowels.  Increasing confusion.  Increasing behavior changes. How is this diagnosed? This condition  is diagnosed by a health care provider who specializes in diseases of the nervous system (neurologist). Other causes of dementia may also be ruled out. Your health care provider will talk with you and your family, friends, or caregivers about your history and symptoms. A thorough medical history will be taken, and you will have a physical exam and tests. Tests may include:  Lab tests, such as blood or urine tests.  Imaging tests, such as a CT scan, a PET scan, or an MRI.  A lumbar puncture. This test involves removing and testing a small amount of the fluid that surrounds the brain and spinal cord.  An electroencephalogram (EEG). In this test, small metal discs are used to measure electrical activity in the brain.  Memory tests, cognitive tests, and neuropsychological tests. These tests evaluate  brain function.  Genetic testing may be done if you have early onset of the disease (before age 11) or if other family members have the disease. How is this treated? At this time, there is no treatment to cure Alzheimer's disease or stop it from getting worse. The goals of treatment are:  To slow down symptoms of the disease, if possible.  To manage behavioral changes.  To provide you with a safe environment.  To help manage daily life for you and your caregivers. The following treatment options are available:  Medicines. Medicines may help to slow down memory loss and manage behavioral symptoms.  Cognitive therapy. Cognitive therapy provides you with education, support, and memory aids. It is most helpful in the early stages of the condition.  Counseling or spiritual guidance. It is normal to have a lot of feelings, including anger, relief, fear, and isolation. Counseling and guidance can help you deal with these feelings.  Caregiving. This involves having caregivers help you with your daily activities.  Family support groups. These provide education, emotional support, and information about community resources to family members who are taking care of you. Follow these instructions at home:  Medicines  Take over-the-counter and prescription medicines only as told by your health care provider.  Use a pill organizer or pill reminder to help you manage your medicines.  Avoid taking medicines that can affect thinking, such as pain medicines or sleeping medicines. Lifestyle  Make healthy lifestyle choices: ? Be physically active as told by your health care provider. Regular exercise may help improve symptoms. ? Do not use any products that contain nicotine or tobacco, such as cigarettes, e-cigarettes, and chewing tobacco. If you need help quitting, ask your health care provider. ? Do not drink alcohol. ? Eat a healthy diet. ? Practice stress-management techniques when you get  stressed. ? Stay social.  Drink enough fluid to keep your urine pale yellow.  Make sure to get quality sleep. ? Avoid taking long naps during the day. Take short naps of 30 minutes or less if needed. ? Keep your sleeping area dark and cool. ? Avoid exercising during the few hours before you go to bed. ? Avoid caffeine products in the afternoon and evening. General instructions  Work with your health care provider to determine what you need help with and what your safety needs are.  If you were given a bracelet that identifies you as a person with memory loss or tracks your location, make sure to wear it at all times.  Talk with your health care provider about whether it is safe for you to drive.  Work with your family to make important decisions, such as  advance directives, medical power of attorney, or a living will.  Keep all follow-up visits as told by your health care provider. This is important. Where to find more information  The Alzheimer's Association: Call the 24-hour helpline at 1-212 413 0315, or visit [employer] Contact a health care provider if:  You have nausea, vomiting, or trouble with eating.  You have dizziness or weakness.  You or your family members become concerned for your safety. Get help right away if:  You feel depressed or sad, or feel that you want to harm yourself.  You develop chest pain or difficulty with breathing.  You pass out. If you ever feel like you may hurt yourself or others, or have thoughts about taking your own life, get help right away. You can go to your nearest emergency department or call:  Your local emergency services (911 in the [place]  A suicide crisis helpline, such as the [place] at (402)336-1917. This is open 24 hours a day. Summary  Alzheimer's disease is a brain disease that affects memory, thinking, language, and behavior. Alzheimer's disease is a form of dementia.  This condition is  diagnosed by a specialist in diseases of the nervous system (neurologist).  At this time, there is no treatment to cure Alzheimer's disease or stop it from getting worse. The goals of treatment are to slow memory loss and help you manage any symptoms.  Work with your family to make important decisions, such as advance directives, medical power of attorney, or a living will. This information is not intended to replace advice given to you by your health care provider. Make sure you discuss any questions you have with your health care provider. Document Revised: 04/05/2018 Document Reviewed: 04/05/2018 Elsevier Patient Education  2020 [employer].

## 2019-05-24 LAB — PROTEIN ELECTROPHORESIS, SERUM
Albumin ELP: 4 g/dL (ref 3.8–4.8)
Alpha 1: 0.3 g/dL (ref 0.2–0.3)
Alpha 2: 0.8 g/dL (ref 0.5–0.9)
Beta 2: 0.3 g/dL (ref 0.2–0.5)
Beta Globulin: 0.4 g/dL (ref 0.4–0.6)
Gamma Globulin: 1.2 g/dL (ref 0.8–1.7)
Total Protein: 7 g/dL (ref 6.1–8.1)

## 2019-05-24 LAB — CBC
HCT: 34.1 % — ABNORMAL LOW (ref 35.0–45.0)
Hemoglobin: 11.7 g/dL (ref 11.7–15.5)
MCH: 32.8 pg (ref 27.0–33.0)
MCHC: 34.3 g/dL (ref 32.0–36.0)
MCV: 95.5 fL (ref 80.0–100.0)
MPV: 10.2 fL (ref 7.5–12.5)
Platelets: 226 10*3/uL (ref 140–400)
RBC: 3.57 10*6/uL — ABNORMAL LOW (ref 3.80–5.10)
RDW: 11.7 % (ref 11.0–15.0)
WBC: 6.8 10*3/uL (ref 3.8–10.8)

## 2019-05-24 LAB — VITAMIN D 25 HYDROXY (VIT D DEFICIENCY, FRACTURES): Vit D, 25-Hydroxy: 62 ng/mL (ref 30–100)

## 2019-05-24 LAB — RPR: RPR Ser Ql: REACTIVE — AB

## 2019-05-24 LAB — TSH: TSH: 1.97 mIU/L (ref 0.40–4.50)

## 2019-05-24 LAB — SEDIMENTATION RATE: Sed Rate: 29 mm/h (ref 0–30)

## 2019-05-24 LAB — RPR TITER: RPR Titer: 1:1 {titer} — ABNORMAL HIGH

## 2019-05-24 LAB — FLUORESCENT TREPONEMAL AB(FTA)-IGG-BLD: Fluorescent Treponemal ABS: NONREACTIVE

## 2019-05-24 LAB — COPPER, BLOOD: Copper, Blood: 86 ug/dL

## 2019-05-24 LAB — VITAMIN B12: Vitamin B-12: 455 pg/mL (ref 200–1100)

## 2019-05-24 LAB — FOLATE: Folate: >24 ng/mL

## 2019-06-12 ENCOUNTER — Other Ambulatory Visit: Payer: Self-pay | Admitting: Neurology

## 2019-06-12 MED ORDER — HYDROCHLOROTHIAZIDE 25 MG PO TABS: 25.0000 mg | ORAL_TABLET | Freq: Every day | ORAL | 0 refills | Status: DC

## 2019-06-12 MED ORDER — LOSARTAN POTASSIUM 100 MG PO TABS: 100.0000 mg | ORAL_TABLET | Freq: Every day | ORAL | 0 refills | Status: DC

## 2019-06-13 ENCOUNTER — Other Ambulatory Visit: Payer: Self-pay

## 2019-06-13 ENCOUNTER — Ambulatory Visit (INDEPENDENT_AMBULATORY_CARE_PROVIDER_SITE_OTHER): Payer: Medicare HMO | Admitting: Sports Medicine

## 2019-06-13 DIAGNOSIS — F0280 Dementia in other diseases classified elsewhere without behavioral disturbance: Secondary | ICD-10-CM | POA: Diagnosis not present

## 2019-06-13 DIAGNOSIS — G301 Alzheimer's disease with late onset: Secondary | ICD-10-CM

## 2019-06-13 DIAGNOSIS — R69 Illness, unspecified: Secondary | ICD-10-CM | POA: Diagnosis not present

## 2019-06-13 MED ORDER — DONEPEZIL HCL 10 MG PO TABS: 10.0000 mg | ORAL_TABLET | Freq: Every evening | ORAL | 0 refills | Status: DC | PRN

## 2019-06-13 NOTE — Assessment & Plan Note (Signed)
Cassidy Jennings returns, she is a pleasant 79 year old female with what seems to be classic Alzheimer's dementia. I started Aricept 5 mg at the last visit, her Mini-Mental status exam is stable but her family reports improvements in her symptoms and her activities. She has no adverse effects and we are going to bump her up to 10 mg, her follow-up visit will be with Dr. [name].

## 2019-06-13 NOTE — Progress Notes (Signed)
    Procedures performed today:    None.  Independent interpretation of tests performed by another provider:   None.  Impression and Recommendations:    Alzheimer's dementia [facility]) [place] returns, she is a pleasant 79 year old female with what seems to be classic Alzheimer's dementia. I started Aricept 5 mg at the last visit, her Mini-Mental status exam is stable but her family reports improvements in her symptoms and her activities. She has no adverse effects and we are going to bump her up to 10 mg, her follow-up visit will be with Dr. [name].    ___________________________________________ Gwen Her. Dianah Field, M.D., ABFM., CAQSM. Primary Care and [place] Instructor of [place] of [facility] of Medicine

## 2019-07-11 ENCOUNTER — Ambulatory Visit: Payer: Medicare HMO | Admitting: Family Medicine

## 2019-07-14 ENCOUNTER — Ambulatory Visit (INDEPENDENT_AMBULATORY_CARE_PROVIDER_SITE_OTHER): Payer: Medicare HMO | Admitting: Family Medicine

## 2019-07-14 ENCOUNTER — Encounter: Payer: Self-pay | Admitting: Family Medicine

## 2019-07-14 DIAGNOSIS — I10 Essential (primary) hypertension: Secondary | ICD-10-CM

## 2019-07-14 DIAGNOSIS — R69 Illness, unspecified: Secondary | ICD-10-CM | POA: Diagnosis not present

## 2019-07-14 DIAGNOSIS — F0280 Dementia in other diseases classified elsewhere without behavioral disturbance: Secondary | ICD-10-CM

## 2019-07-14 DIAGNOSIS — Z720 Tobacco use: Secondary | ICD-10-CM | POA: Diagnosis not present

## 2019-07-14 DIAGNOSIS — G301 Alzheimer's disease with late onset: Secondary | ICD-10-CM | POA: Diagnosis not present

## 2019-07-14 NOTE — Progress Notes (Signed)
Cassidy Jennings - 79 y.o. female MRN [id number]  Date of birth: December 01, 1940  Subjective Chief Complaint  Patient presents with  . Establish Care    HPI Cassidy Jennings is a 79 y.o. female with history of  HTN, CAD/PAD, CKD and recent dx of Alzheimer's dementia.  She was seen by  Dr. Dianah Field on 05/16/19 for concerns of increased forgetfulness and repetitive actions.  Her MMSE scores were low on medicare exam.  Work up for reversible cause of dementia was completed which and normal.  She was started on donepezil 5mg .  She followed up with Dr. Dianah Field on 06/13/19 and family noted that symptoms were stable.  She was tolerating donepezil well and this was increased to 10mg .  She is back today, accompanied by her daughter in law.  She reports that she is doing well with increased dose. Memory is stable at this time.    She has remained compliant with BP medication.  She does not monitor this at home.  She does unfortunately continue to smoke about 0.75 ppd.  She is not interested in quitting.   ROS:  A comprehensive ROS was completed and negative except as noted per HPI  Allergies  Allergen Reactions  . Simvastatin Nausea And Vomiting    gerd gerd    Past Medical History:  Diagnosis Date  . Chronic kidney disease   . Hyperlipidemia   . Hypertension   . Prediabetes     Past Surgical History:  Procedure Laterality Date  . TUBAL LIGATION      Social History   Socioeconomic History  . Marital status: Widowed    Spouse name: Not on file  . Number of children: 6  . Years of education: 61  . Highest education level: 12th grade  Occupational History  . Occupation: cleaning lady    Comment: retired  Tobacco Use  . Smoking status: Current Every Day Smoker    Packs/day: 0.25    Years: 63.00    Pack years: 15.75    Types: Cigarettes  . Smokeless tobacco: Never Used  Substance and Sexual Activity  . Alcohol use: No    Alcohol/week: 0.0 standard drinks  . Drug use: No  .  Sexual activity: Not Currently  Other Topics Concern  . Not on file  Social History Narrative   She smokes but has multiple family members that smoke. Stays active but no regular exercise. Walks to mail box and back daily is how she gets her exercise.   Social Determinants of Health   Financial Resource Strain: Low Risk   . Difficulty of Paying Living Expenses: Not hard at all  Food Insecurity: No Food Insecurity  . Worried About Charity fundraiser in the Last Year: Never true  . Ran Out of Food in the Last Year: Never true  Transportation Needs: No Transportation Needs  . Lack of Transportation (Medical): No  . Lack of Transportation (Non-Medical): No  Physical Activity: Insufficiently Active  . Days of Exercise per Week: 3 days  . Minutes of Exercise per Session: 10 min  Stress: No Stress Concern Present  . Feeling of Stress : Not at all  Social Connections: Somewhat Isolated  . Frequency of Communication with Friends and Family: More than three times a week  . Frequency of Social Gatherings with Friends and Family: Twice a week  . Attends Religious Services: 1 to 4 times per year  . Active Member of Clubs or Organizations: No  . Attends Club  or Organization Meetings: Never  . Marital Status: Widowed    Family History  Problem Relation Age of Onset  . Hypertension Mother   . Heart attack Mother 62  . Heart disease Mother   . Hypertension Sister   . Hypertension Daughter   . Hypertension Son     Health Maintenance  Topic Date Due  . Samul Dada  04/20/2021  . INFLUENZA VACCINE  Completed  . DEXA SCAN  Completed  . PNA vac Low Risk Adult  Completed     ----------------------------------------------------------------------------------------------------------------------------------------------------------------------------------------------------------------- Physical Exam BP (!) 146/68   Pulse 96   Temp 97.8 F (36.6 C) (Oral)   Ht 5\' 4"  (1.626 m)   Wt 124 lb  (56.2 kg)   BMI 21.28 kg/m   Physical Exam Constitutional:      Appearance: Normal appearance.  HENT:     Head: Normocephalic and atraumatic.  Eyes:     General: No scleral icterus. Cardiovascular:     Rate and Rhythm: Normal rate and regular rhythm.  Pulmonary:     Effort: Pulmonary effort is normal.     Breath sounds: Normal breath sounds.  Musculoskeletal:        General: Normal range of motion.     Cervical back: Neck supple.     Right lower leg: No edema.     Left lower leg: No edema.  Neurological:     General: No focal deficit present.     Mental Status: She is alert.  Psychiatric:        Mood and Affect: Mood normal.        Behavior: Behavior normal.     ------------------------------------------------------------------------------------------------------------------------------------------------------------------------------------------------------------------- Assessment and Plan  Hypertension Blood pressure is at goal at for age and co-morbidities.  I recommend she continue current medications.  In addition they were instructed to follow a low sodium diet with regular exercise to help to maintain adequate control of blood pressure.    Alzheimer's dementia ([place]) Stable at this time. Conitnue aricept at 10mg  dose.  Discussed doing things to keep her mind active including puzzles, word search, etc Recommend she quit smoking.  F/u in 3 months.   Tobacco abuse Counseled on smoking cessation. This is made more difficult by the fact that other family members continue to smoke as well.    No orders of the defined types were placed in this encounter.   Return in about 3 months (around 10/14/2019) for Dementia/HTN.    This visit occurred during the SARS-CoV-2 public health emergency.  Safety protocols were in place, including screening questions prior to the visit, additional usage of staff PPE, and extensive cleaning of exam room while observing appropriate  contact time as indicated for disinfecting solutions.

## 2019-07-14 NOTE — Patient Instructions (Signed)
Very nice to meet you today! Please continue current medications.  See me again in 3 months.    I would recommend that you have a COVID vaccine:  COVID-19 Vaccine Information can be found at: ShippingScam.co.uk For questions related to vaccine distribution or appointments, please email [email or web address] [email or web address] or call [phone].

## 2019-07-14 NOTE — Assessment & Plan Note (Signed)
Stable at this time. Conitnue aricept at 10mg  dose.  Discussed doing things to keep her mind active including puzzles, word search, etc Recommend she quit smoking.  F/u in 3 months.

## 2019-07-14 NOTE — Assessment & Plan Note (Signed)
Blood pressure is at goal at for age and co-morbidities.  I recommend she continue current medications.  In addition they were instructed to follow a low sodium diet with regular exercise to help to maintain adequate control of blood pressure.   

## 2019-07-14 NOTE — Assessment & Plan Note (Signed)
Counseled on smoking cessation. This is made more difficult by the fact that other family members continue to smoke as well.

## 2019-07-17 DIAGNOSIS — I6523 Occlusion and stenosis of bilateral carotid arteries: Secondary | ICD-10-CM | POA: Diagnosis not present

## 2019-07-21 DIAGNOSIS — I701 Atherosclerosis of renal artery: Secondary | ICD-10-CM | POA: Diagnosis not present

## 2019-07-21 DIAGNOSIS — R69 Illness, unspecified: Secondary | ICD-10-CM | POA: Diagnosis not present

## 2019-07-21 DIAGNOSIS — I6523 Occlusion and stenosis of bilateral carotid arteries: Secondary | ICD-10-CM | POA: Diagnosis not present

## 2019-07-21 DIAGNOSIS — I739 Peripheral vascular disease, unspecified: Secondary | ICD-10-CM | POA: Diagnosis not present

## 2019-09-28 ENCOUNTER — Other Ambulatory Visit: Payer: Self-pay | Admitting: Neurology

## 2019-09-28 MED ORDER — HYDROCHLOROTHIAZIDE 25 MG PO TABS: 25.0000 mg | ORAL_TABLET | Freq: Every day | ORAL | 0 refills | Status: DC

## 2019-09-28 MED ORDER — LOSARTAN POTASSIUM 100 MG PO TABS: 100.0000 mg | ORAL_TABLET | Freq: Every day | ORAL | 0 refills | Status: DC

## 2019-09-28 NOTE — Addendum Note (Signed)
Addended byAnnamaria Helling on: 09/28/2019 10:12 AM   Modules accepted: Orders

## 2019-10-14 ENCOUNTER — Other Ambulatory Visit: Payer: Self-pay | Admitting: Family Medicine

## 2019-10-14 DIAGNOSIS — E782 Mixed hyperlipidemia: Secondary | ICD-10-CM

## 2019-10-18 ENCOUNTER — Other Ambulatory Visit: Payer: Self-pay

## 2019-10-18 DIAGNOSIS — E782 Mixed hyperlipidemia: Secondary | ICD-10-CM

## 2019-10-18 MED ORDER — ATORVASTATIN CALCIUM 40 MG PO TABS: 40.0000 mg | ORAL_TABLET | Freq: Every day | ORAL | 1 refills | Status: DC

## 2019-10-26 ENCOUNTER — Telehealth: Payer: Self-pay

## 2019-10-26 NOTE — Telephone Encounter (Signed)
Cassidy Jennings with [facility] called and left a message stating Cassidy Jennings would like a referral for her chronic kidney disease.

## 2019-10-31 DIAGNOSIS — Z7982 Long term (current) use of aspirin: Secondary | ICD-10-CM | POA: Diagnosis not present

## 2019-10-31 DIAGNOSIS — Z803 Family history of malignant neoplasm of breast: Secondary | ICD-10-CM | POA: Diagnosis not present

## 2019-10-31 DIAGNOSIS — Z8249 Family history of ischemic heart disease and other diseases of the circulatory system: Secondary | ICD-10-CM | POA: Diagnosis not present

## 2019-10-31 DIAGNOSIS — Z86711 Personal history of pulmonary embolism: Secondary | ICD-10-CM | POA: Diagnosis not present

## 2019-10-31 DIAGNOSIS — E785 Hyperlipidemia, unspecified: Secondary | ICD-10-CM | POA: Diagnosis not present

## 2019-10-31 DIAGNOSIS — Z7902 Long term (current) use of antithrombotics/antiplatelets: Secondary | ICD-10-CM | POA: Diagnosis not present

## 2019-10-31 DIAGNOSIS — Z833 Family history of diabetes mellitus: Secondary | ICD-10-CM | POA: Diagnosis not present

## 2019-10-31 DIAGNOSIS — G3184 Mild cognitive impairment, so stated: Secondary | ICD-10-CM | POA: Diagnosis not present

## 2019-10-31 DIAGNOSIS — Z720 Tobacco use: Secondary | ICD-10-CM | POA: Diagnosis not present

## 2019-10-31 DIAGNOSIS — I10 Essential (primary) hypertension: Secondary | ICD-10-CM | POA: Diagnosis not present

## 2019-11-04 DIAGNOSIS — K922 Gastrointestinal hemorrhage, unspecified: Secondary | ICD-10-CM | POA: Diagnosis not present

## 2019-11-04 DIAGNOSIS — I129 Hypertensive chronic kidney disease with stage 1 through stage 4 chronic kidney disease, or unspecified chronic kidney disease: Secondary | ICD-10-CM | POA: Diagnosis not present

## 2019-11-04 DIAGNOSIS — K2950 Unspecified chronic gastritis without bleeding: Secondary | ICD-10-CM | POA: Diagnosis not present

## 2019-11-04 DIAGNOSIS — I10 Essential (primary) hypertension: Secondary | ICD-10-CM | POA: Diagnosis not present

## 2019-11-04 DIAGNOSIS — N179 Acute kidney failure, unspecified: Secondary | ICD-10-CM | POA: Diagnosis not present

## 2019-11-04 DIAGNOSIS — N1832 Chronic kidney disease, stage 3b: Secondary | ICD-10-CM | POA: Diagnosis not present

## 2019-11-04 DIAGNOSIS — K920 Hematemesis: Secondary | ICD-10-CM | POA: Diagnosis not present

## 2019-11-04 DIAGNOSIS — Z7901 Long term (current) use of anticoagulants: Secondary | ICD-10-CM | POA: Diagnosis not present

## 2019-11-04 DIAGNOSIS — E871 Hypo-osmolality and hyponatremia: Secondary | ICD-10-CM | POA: Insufficient documentation

## 2019-11-04 DIAGNOSIS — G9341 Metabolic encephalopathy: Secondary | ICD-10-CM | POA: Diagnosis not present

## 2019-11-04 DIAGNOSIS — D500 Iron deficiency anemia secondary to blood loss (chronic): Secondary | ICD-10-CM | POA: Diagnosis not present

## 2019-11-04 DIAGNOSIS — R69 Illness, unspecified: Secondary | ICD-10-CM | POA: Diagnosis not present

## 2019-11-04 DIAGNOSIS — N1831 Chronic kidney disease, stage 3a: Secondary | ICD-10-CM | POA: Diagnosis not present

## 2019-11-04 DIAGNOSIS — D62 Acute posthemorrhagic anemia: Secondary | ICD-10-CM | POA: Diagnosis not present

## 2019-11-04 DIAGNOSIS — R1013 Epigastric pain: Secondary | ICD-10-CM | POA: Diagnosis not present

## 2019-11-04 DIAGNOSIS — E785 Hyperlipidemia, unspecified: Secondary | ICD-10-CM | POA: Diagnosis not present

## 2019-11-04 DIAGNOSIS — E861 Hypovolemia: Secondary | ICD-10-CM | POA: Diagnosis not present

## 2019-11-04 DIAGNOSIS — R9431 Abnormal electrocardiogram [ECG] [EKG]: Secondary | ICD-10-CM | POA: Diagnosis not present

## 2019-11-04 DIAGNOSIS — I6523 Occlusion and stenosis of bilateral carotid arteries: Secondary | ICD-10-CM | POA: Diagnosis not present

## 2019-11-04 DIAGNOSIS — G9340 Encephalopathy, unspecified: Secondary | ICD-10-CM | POA: Diagnosis not present

## 2019-11-04 DIAGNOSIS — K226 Gastro-esophageal laceration-hemorrhage syndrome: Secondary | ICD-10-CM | POA: Diagnosis not present

## 2019-11-04 DIAGNOSIS — I701 Atherosclerosis of renal artery: Secondary | ICD-10-CM | POA: Diagnosis not present

## 2019-11-04 DIAGNOSIS — K279 Peptic ulcer, site unspecified, unspecified as acute or chronic, without hemorrhage or perforation: Secondary | ICD-10-CM | POA: Diagnosis not present

## 2019-11-04 DIAGNOSIS — K264 Chronic or unspecified duodenal ulcer with hemorrhage: Secondary | ICD-10-CM | POA: Diagnosis not present

## 2019-11-04 DIAGNOSIS — K254 Chronic or unspecified gastric ulcer with hemorrhage: Secondary | ICD-10-CM | POA: Diagnosis not present

## 2019-11-05 DIAGNOSIS — K920 Hematemesis: Secondary | ICD-10-CM | POA: Insufficient documentation

## 2019-11-05 DIAGNOSIS — G9340 Encephalopathy, unspecified: Secondary | ICD-10-CM | POA: Insufficient documentation

## 2019-11-05 NOTE — Telephone Encounter (Signed)
Recommend having her stop in to have updated kidney function checked as well.  Please order BMP and link to dx: chronic kidney disease, hypertension  Thanks!

## 2019-11-06 DIAGNOSIS — K226 Gastro-esophageal laceration-hemorrhage syndrome: Secondary | ICD-10-CM | POA: Insufficient documentation

## 2019-11-06 NOTE — Telephone Encounter (Signed)
Contacted patient. She is currently admitted to [facility]. Advised we would follow-up once she's discharged.

## 2019-11-10 ENCOUNTER — Other Ambulatory Visit: Payer: Self-pay

## 2019-11-10 ENCOUNTER — Ambulatory Visit (INDEPENDENT_AMBULATORY_CARE_PROVIDER_SITE_OTHER): Payer: Medicare HMO | Admitting: Family Medicine

## 2019-11-10 ENCOUNTER — Encounter: Payer: Self-pay | Admitting: Family Medicine

## 2019-11-10 VITALS — BP 147/61 | HR 85 | Ht 64.17 in | Wt 129.0 lb

## 2019-11-10 DIAGNOSIS — D500 Iron deficiency anemia secondary to blood loss (chronic): Secondary | ICD-10-CM

## 2019-11-10 DIAGNOSIS — E871 Hypo-osmolality and hyponatremia: Secondary | ICD-10-CM | POA: Diagnosis not present

## 2019-11-10 DIAGNOSIS — I10 Essential (primary) hypertension: Secondary | ICD-10-CM

## 2019-11-10 DIAGNOSIS — R69 Illness, unspecified: Secondary | ICD-10-CM | POA: Diagnosis not present

## 2019-11-10 DIAGNOSIS — N1831 Chronic kidney disease, stage 3a: Secondary | ICD-10-CM

## 2019-11-10 DIAGNOSIS — G301 Alzheimer's disease with late onset: Secondary | ICD-10-CM

## 2019-11-10 DIAGNOSIS — F0280 Dementia in other diseases classified elsewhere without behavioral disturbance: Secondary | ICD-10-CM

## 2019-11-10 LAB — COMPLETE METABOLIC PANEL WITH GFR
AG Ratio: 1.5 (calc) (ref 1.0–2.5)
ALT: 18 U/L (ref 6–29)
AST: 15 U/L (ref 10–35)
Albumin: 3.5 g/dL — ABNORMAL LOW (ref 3.6–5.1)
Alkaline phosphatase (APISO): 45 U/L (ref 37–153)
BUN/Creatinine Ratio: 10 (calc) (ref 6–22)
BUN: 12 mg/dL (ref 7–25)
CO2: 28 mmol/L (ref 20–32)
Calcium: 9.1 mg/dL (ref 8.6–10.4)
Chloride: 96 mmol/L — ABNORMAL LOW (ref 98–110)
Creat: 1.18 mg/dL — ABNORMAL HIGH (ref 0.60–0.93)
GFR, Est African American: 51 mL/min/{1.73_m2} — ABNORMAL LOW (ref >=60)
GFR, Est Non African American: 44 mL/min/{1.73_m2} — ABNORMAL LOW (ref >=60)
Globulin: 2.3 g/dL (calc) (ref 1.9–3.7)
Glucose, Bld: 120 mg/dL — ABNORMAL HIGH (ref 65–99)
Potassium: 3.8 mmol/L (ref 3.5–5.3)
Sodium: 130 mmol/L — ABNORMAL LOW (ref 135–146)
Total Bilirubin: 0.4 mg/dL (ref 0.2–1.2)
Total Protein: 5.8 g/dL — ABNORMAL LOW (ref 6.1–8.1)

## 2019-11-10 LAB — CBC
HCT: 25.2 % — ABNORMAL LOW (ref 35.0–45.0)
Hemoglobin: 8.7 g/dL — ABNORMAL LOW (ref 11.7–15.5)
MCH: 33 pg (ref 27.0–33.0)
MCHC: 34.5 g/dL (ref 32.0–36.0)
MCV: 95.5 fL (ref 80.0–100.0)
MPV: 10 fL (ref 7.5–12.5)
Platelets: 290 10*3/uL (ref 140–400)
RBC: 2.64 10*6/uL — ABNORMAL LOW (ref 3.80–5.10)
RDW: 11.8 % (ref 11.0–15.0)
WBC: 6.1 10*3/uL (ref 3.8–10.8)

## 2019-11-10 MED ORDER — AMLODIPINE BESYLATE 2.5 MG PO TABS: 2.5000 mg | ORAL_TABLET | Freq: Every day | ORAL | 3 refills | Status: DC

## 2019-11-10 MED ORDER — DONEPEZIL HCL 10 MG PO TABS: 10.0000 mg | ORAL_TABLET | Freq: Every evening | ORAL | 1 refills | Status: DC | PRN

## 2019-11-10 MED ORDER — PANTOPRAZOLE SODIUM 40 MG PO TBEC
40.0000 mg | DELAYED_RELEASE_TABLET | Freq: Two times a day (BID) | ORAL | 0 refills | Status: DC
Start: 2019-11-10 — End: 2019-11-21

## 2019-11-10 NOTE — Assessment & Plan Note (Signed)
Blood pressure is at goal at for age and co-morbidities.  I recommend she continue amlodipine at current dose. In addition they were instructed to follow a low sodium diet with regular exercise to help to maintain adequate control of blood pressure.

## 2019-11-10 NOTE — Assessment & Plan Note (Addendum)
Improved with holding HCTZ and addition of salt tabs. Recheck today.

## 2019-11-10 NOTE — Assessment & Plan Note (Signed)
Update renal function.  Referral to nephrology entered per family request.

## 2019-11-10 NOTE — Progress Notes (Signed)
Cassidy Jennings - 79 y.o. female MRN [id number]  Date of birth: 08/18/1940  Subjective Chief Complaint  Patient presents with  . Hospitalization Follow-up    HPI Cassidy Jennings is a 79 y.o. female with history of HTN, CKD 3, alzheimer's dementia, and carotid disease here today for hospital follow up.  She was recently hospitalized at [facility] for hematemesis.  Had EGD showing mallory weiss tear and superficial ulcerations in the stomach and duodenum.  Started on pantoprazole BID.  Hgb remained stable and was 8.2 at time of discharge.  She denies any abdominal pain or recurrent nausea today.  Appetite is ok.  Bowels are moving normally.  Stool is normal color.   She does still feel tired but denies dizziness or chest pain.    Sodium also low in the hospital.  HCTZ discontinued and replaced with amlodipine.   Family has noticed some cognitive decline since being in the hospital.  Would like referral to neuro for her alzheimers.  She is taking aricept 10mg  daily.    They are also requesting referral to nephrology for her kidney disease.    ROS:  A comprehensive ROS was completed and negative except as noted per HPI  Allergies  Allergen Reactions  . Simvastatin Nausea And Vomiting    gerd gerd    Past Medical History:  Diagnosis Date  . Chronic kidney disease   . Hyperlipidemia   . Hypertension   . Prediabetes     Past Surgical History:  Procedure Laterality Date  . TUBAL LIGATION      Social History   Socioeconomic History  . Marital status: Widowed    Spouse name: Not on file  . Number of children: 6  . Years of education: 94  . Highest education level: 12th grade  Occupational History  . Occupation: cleaning lady    Comment: retired  Tobacco Use  . Smoking status: Current Every Day Smoker    Packs/day: 0.25    Years: 63.00    Pack years: 15.75    Types: Cigarettes  . Smokeless tobacco: Never Used  Vaping Use  . Vaping Use: Never used  Substance and  Sexual Activity  . Alcohol use: No    Alcohol/week: 0.0 standard drinks  . Drug use: No  . Sexual activity: Not Currently  Other Topics Concern  . Not on file  Social History Narrative   She smokes but has multiple family members that smoke. Stays active but no regular exercise. Walks to mail box and back daily is how she gets her exercise.   Social Determinants of Health   Financial Resource Strain:   . Difficulty of Paying Living Expenses:   Food Insecurity:   . Worried About Charity fundraiser in the Last Year:   . Arboriculturist in the Last Year:   Transportation Needs:   . Film/video editor (Medical):   Marland Kitchen Lack of Transportation (Non-Medical):   Physical Activity:   . Days of Exercise per Week:   . Minutes of Exercise per Session:   Stress:   . Feeling of Stress :   Social Connections:   . Frequency of Communication with Friends and Family:   . Frequency of Social Gatherings with Friends and Family:   . Attends Religious Services:   . Active Member of Clubs or Organizations:   . Attends Archivist Meetings:   Marland Kitchen Marital Status:     Family History  Problem Relation Age of  Onset  . Hypertension Mother   . Heart attack Mother 33  . Heart disease Mother   . Hypertension Sister   . Hypertension Daughter   . Hypertension Son     Health Maintenance  Topic Date Due  . Hepatitis C Screening  Never done  . COVID-19 Vaccine (1) Never done  . INFLUENZA VACCINE  12/10/2019  . TETANUS/TDAP  04/20/2021  . DEXA SCAN  Completed  . PNA vac Low Risk Adult  Completed     ----------------------------------------------------------------------------------------------------------------------------------------------------------------------------------------------------------------- Physical Exam BP (!) 147/61 (BP Location: Left Arm, Patient Position: Sitting, Cuff Size: Normal)   Pulse 85   Ht 5' 4.17" (1.63 m)   Wt 129 lb (58.5 kg)   SpO2 100%   BMI 22.02  kg/m   Physical Exam Constitutional:      Appearance: Normal appearance.  HENT:     Head: Normocephalic and atraumatic.  Eyes:     General: No scleral icterus. Cardiovascular:     Rate and Rhythm: Normal rate and regular rhythm.  Pulmonary:     Effort: Pulmonary effort is normal.     Breath sounds: Normal breath sounds.  Abdominal:     General: Abdomen is flat. There is no distension.     Palpations: Abdomen is soft.     Tenderness: There is no abdominal tenderness.  Musculoskeletal:     Cervical back: Neck supple.  Neurological:     General: No focal deficit present.     Mental Status: She is alert.  Psychiatric:        Mood and Affect: Mood normal.     ------------------------------------------------------------------------------------------------------------------------------------------------------------------------------------------------------------------- Assessment and Plan  Hypertension Blood pressure is at goal at for age and co-morbidities.  I recommend she continue amlodipine at current dose. In addition they were instructed to follow a low sodium diet with regular exercise to help to maintain adequate control of blood pressure.    CKD (chronic kidney disease) stage 3, GFR 30-59 ml/min Update renal function.  Referral to nephrology entered per family request.   Acute hyponatremia Improved with holding HCTZ and addition of salt tabs. Recheck today.   Blood loss anemia Did not require transfusion. Continue PPI  Has appt with GI on 7/14.  Update CBC today.    Alzheimer's dementia ([place]) Some worsening since being in the hospital.  She will continue aricept.  Referral entered to neurology per family request.    Meds ordered this encounter  Medications  . donepezil (ARICEPT) 10 MG tablet    Sig: Take 1 tablet (10 mg total) by mouth at bedtime as needed.    Dispense:  90 tablet    Refill:  1  . amLODipine (NORVASC) 2.5 MG tablet    Sig: Take 1 tablet  (2.5 mg total) by mouth daily.    Dispense:  90 tablet    Refill:  3  . pantoprazole (PROTONIX) 40 MG tablet    Sig: Take 1 tablet (40 mg total) by mouth 2 (two) times daily.    Dispense:  60 tablet    Refill:  0    No follow-ups on file.    This visit occurred during the SARS-CoV-2 public health emergency.  Safety protocols were in place, including screening questions prior to the visit, additional usage of staff PPE, and extensive cleaning of exam room while observing appropriate contact time as indicated for disinfecting solutions.

## 2019-11-10 NOTE — Assessment & Plan Note (Signed)
Some worsening since being in the hospital.  She will continue aricept.  Referral entered to neurology per family request.

## 2019-11-10 NOTE — Patient Instructions (Signed)
Please have labs completed today.  Continue current medications.  Keep appt with GI.  I have entered referrals to kidney specialist and neurologist.

## 2019-11-10 NOTE — Assessment & Plan Note (Signed)
Did not require transfusion. Continue PPI  Has appt with GI on 7/14.  Update CBC today.

## 2019-11-14 DIAGNOSIS — R69 Illness, unspecified: Secondary | ICD-10-CM | POA: Diagnosis not present

## 2019-11-21 ENCOUNTER — Other Ambulatory Visit: Payer: Self-pay

## 2019-11-21 MED ORDER — PANTOPRAZOLE SODIUM 40 MG PO TBEC: 40.0000 mg | DELAYED_RELEASE_TABLET | Freq: Two times a day (BID) | ORAL | 0 refills | Status: DC

## 2019-11-22 DIAGNOSIS — I10 Essential (primary) hypertension: Secondary | ICD-10-CM | POA: Diagnosis not present

## 2019-11-22 DIAGNOSIS — K219 Gastro-esophageal reflux disease without esophagitis: Secondary | ICD-10-CM | POA: Diagnosis not present

## 2019-11-22 DIAGNOSIS — D649 Anemia, unspecified: Secondary | ICD-10-CM | POA: Diagnosis not present

## 2019-11-22 DIAGNOSIS — K226 Gastro-esophageal laceration-hemorrhage syndrome: Secondary | ICD-10-CM | POA: Diagnosis not present

## 2019-11-22 DIAGNOSIS — K279 Peptic ulcer, site unspecified, unspecified as acute or chronic, without hemorrhage or perforation: Secondary | ICD-10-CM | POA: Diagnosis not present

## 2019-11-27 ENCOUNTER — Telehealth: Payer: Self-pay

## 2019-11-27 NOTE — Telephone Encounter (Signed)
Pt's son Cassidy Jennings called stating Cassidy Jennings's GI doctor was concerned about her being on both plavix and 81mg  aspirin.   Cassidy Jennings states they are having a hard time getting Cassidy Jennings to sleep at night. They've tried melatonin but it doesn't seem to be working well.   Messages forwarded to Dr. Zigmund Daniel for his advice.

## 2019-11-29 ENCOUNTER — Other Ambulatory Visit: Payer: Self-pay | Admitting: Family Medicine

## 2019-11-29 MED ORDER — TRAZODONE HCL 50 MG PO TABS: 25.0000 mg | ORAL_TABLET | Freq: Every evening | ORAL | 3 refills | Status: DC | PRN

## 2019-11-29 NOTE — Telephone Encounter (Signed)
We can try adding trazodone for sleep.  I would recommend that she continue plavix and aspirin for now.

## 2019-12-05 DIAGNOSIS — R69 Illness, unspecified: Secondary | ICD-10-CM | POA: Diagnosis not present

## 2019-12-14 ENCOUNTER — Encounter: Payer: Self-pay | Admitting: Family Medicine

## 2019-12-14 ENCOUNTER — Other Ambulatory Visit: Payer: Self-pay

## 2019-12-14 ENCOUNTER — Ambulatory Visit (INDEPENDENT_AMBULATORY_CARE_PROVIDER_SITE_OTHER): Payer: Medicare HMO | Admitting: Family Medicine

## 2019-12-14 DIAGNOSIS — I872 Venous insufficiency (chronic) (peripheral): Secondary | ICD-10-CM

## 2019-12-14 NOTE — Patient Instructions (Signed)

## 2019-12-14 NOTE — Progress Notes (Signed)
Cassidy Jennings - 79 y.o. female MRN [id number]  Date of birth: 1940-11-16  Subjective Chief Complaint  Patient presents with  . Foot Swelling    HPI Cassidy Jennings is a 79 y.o. female with history of PAD, HTN, GERD, alzheimer's and CKD brought in today by her daughter in law due to intermittent swelling of her feet and ankles.  She has been sitting more frequently with legs in dependent position.  Swelling is bilateral.  She denies pain associated with swelling, She has not had shortness of breath, chest pain, palpitations, dizziness.  ROS:  A comprehensive ROS was completed and negative except as noted per HPI  Allergies  Allergen Reactions  . Simvastatin Nausea And Vomiting    gerd gerd    Past Medical History:  Diagnosis Date  . Chronic kidney disease   . Hyperlipidemia   . Hypertension   . Prediabetes     Past Surgical History:  Procedure Laterality Date  . TUBAL LIGATION      Social History   Socioeconomic History  . Marital status: Widowed    Spouse name: Not on file  . Number of children: 6  . Years of education: 29  . Highest education level: 12th grade  Occupational History  . Occupation: cleaning lady    Comment: retired  Tobacco Use  . Smoking status: Current Every Day Smoker    Packs/day: 0.25    Years: 63.00    Pack years: 15.75    Types: Cigarettes  . Smokeless tobacco: Never Used  Vaping Use  . Vaping Use: Never used  Substance and Sexual Activity  . Alcohol use: No    Alcohol/week: 0.0 standard drinks  . Drug use: No  . Sexual activity: Not Currently  Other Topics Concern  . Not on file  Social History Narrative   She smokes but has multiple family members that smoke. Stays active but no regular exercise. Walks to mail box and back daily is how she gets her exercise.   Social Determinants of Health   Financial Resource Strain:   . Difficulty of Paying Living Expenses:   Food Insecurity:   . Worried About Charity fundraiser in  the Last Year:   . Arboriculturist in the Last Year:   Transportation Needs:   . Film/video editor (Medical):   Marland Kitchen Lack of Transportation (Non-Medical):   Physical Activity:   . Days of Exercise per Week:   . Minutes of Exercise per Session:   Stress:   . Feeling of Stress :   Social Connections:   . Frequency of Communication with Friends and Family:   . Frequency of Social Gatherings with Friends and Family:   . Attends Religious Services:   . Active Member of Clubs or Organizations:   . Attends Archivist Meetings:   Marland Kitchen Marital Status:     Family History  Problem Relation Age of Onset  . Hypertension Mother   . Heart attack Mother 59  . Heart disease Mother   . Hypertension Sister   . Hypertension Daughter   . Hypertension Son     Health Maintenance  Topic Date Due  . Hepatitis C Screening  Never done  . COVID-19 Vaccine (1) Never done  . INFLUENZA VACCINE  12/10/2019  . TETANUS/TDAP  04/20/2021  . DEXA SCAN  Completed  . PNA vac Low Risk Adult  Completed     ----------------------------------------------------------------------------------------------------------------------------------------------------------------------------------------------------------------- Physical Exam BP 139/68 (BP Location: Left Arm,  Patient Position: Sitting, Cuff Size: Small)   Pulse 97   Temp (!) 97.5 F (36.4 C) (Temporal)   Ht 5' 4.17" (1.63 m)   Wt 139 lb (63 kg)   SpO2 97%   BMI 23.73 kg/m   Physical Exam Constitutional:      Appearance: Normal appearance.  HENT:     Head: Normocephalic and atraumatic.  Eyes:     General: No scleral icterus. Cardiovascular:     Rate and Rhythm: Normal rate and regular rhythm.  Pulmonary:     Effort: Pulmonary effort is normal.     Breath sounds: Normal breath sounds.  Musculoskeletal:     Cervical back: Neck supple.     Right lower leg: No edema.     Left lower leg: No edema.  Neurological:     General: No focal  deficit present.     Mental Status: She is alert.  Psychiatric:        Mood and Affect: Mood normal.        Behavior: Behavior normal.     ------------------------------------------------------------------------------------------------------------------------------------------------------------------------------------------------------------------- Assessment and Plan  Venous insufficiency No significant edema noted on exam today.   Discussed elevating legs and not let hang if sitting for prolonged periods.  Can try compression stockings if desired.  No signs of CHF/volume overload at this time.    No orders of the defined types were placed in this encounter.   No follow-ups on file.    This visit occurred during the SARS-CoV-2 public health emergency.  Safety protocols were in place, including screening questions prior to the visit, additional usage of staff PPE, and extensive cleaning of exam room while observing appropriate contact time as indicated for disinfecting solutions.

## 2019-12-14 NOTE — Assessment & Plan Note (Addendum)
No significant edema noted on exam today.   Discussed elevating legs and not let hang if sitting for prolonged periods.  Can try compression stockings if desired.  No signs of CHF/volume overload at this time.

## 2019-12-20 DIAGNOSIS — R69 Illness, unspecified: Secondary | ICD-10-CM | POA: Diagnosis not present

## 2019-12-20 DIAGNOSIS — I10 Essential (primary) hypertension: Secondary | ICD-10-CM | POA: Diagnosis not present

## 2019-12-20 DIAGNOSIS — R4189 Other symptoms and signs involving cognitive functions and awareness: Secondary | ICD-10-CM | POA: Diagnosis not present

## 2019-12-20 DIAGNOSIS — I6523 Occlusion and stenosis of bilateral carotid arteries: Secondary | ICD-10-CM | POA: Diagnosis not present

## 2019-12-20 DIAGNOSIS — I701 Atherosclerosis of renal artery: Secondary | ICD-10-CM | POA: Diagnosis not present

## 2020-01-24 DIAGNOSIS — K219 Gastro-esophageal reflux disease without esophagitis: Secondary | ICD-10-CM | POA: Diagnosis not present

## 2020-01-24 DIAGNOSIS — D649 Anemia, unspecified: Secondary | ICD-10-CM | POA: Diagnosis not present

## 2020-01-24 DIAGNOSIS — K226 Gastro-esophageal laceration-hemorrhage syndrome: Secondary | ICD-10-CM | POA: Diagnosis not present

## 2020-01-24 DIAGNOSIS — K279 Peptic ulcer, site unspecified, unspecified as acute or chronic, without hemorrhage or perforation: Secondary | ICD-10-CM | POA: Diagnosis not present

## 2020-01-24 DIAGNOSIS — D509 Iron deficiency anemia, unspecified: Secondary | ICD-10-CM | POA: Diagnosis not present

## 2020-01-24 DIAGNOSIS — I10 Essential (primary) hypertension: Secondary | ICD-10-CM | POA: Diagnosis not present

## 2020-01-30 ENCOUNTER — Telehealth: Payer: Self-pay

## 2020-01-30 NOTE — Telephone Encounter (Signed)
Ms. Rosey's daughter-in-law Savita Runner called concerned about Hypertension.   Ms. Semiah's sister passed away last week and her blood pressure has been elevated since then.   Estill Bamberg states Ms. Macaela is taking her medication faithful Estill Bamberg administers her meds daily).  Log of BP:  9/14   169/93 9/15   187/84 9/20    176/92 9/21    170/88  Dr. West Pugh has been updated on her condition. This message forwarded to him as well.

## 2020-01-30 NOTE — Telephone Encounter (Signed)
Called number listed in chart to get in touch with family but no answer.   Please try to contact again:  -Please pass along my condolences to the family.   -Recommend increase of amlodipine to 2 tablets daily for now and monitor BP daily.    Thanks!  CM

## 2020-01-31 NOTE — Telephone Encounter (Signed)
Spoke to patient's daughter-in-law Estill Bamberg [phone] concerning medication change.   Stayed on phone while Estill Bamberg found the correct pill bottle and was assured of the increase from 2.5mg  to 5mg  QD amlodipine.  All questions were answered and condolences expressed.

## 2020-02-09 ENCOUNTER — Telehealth: Payer: Self-pay

## 2020-02-09 NOTE — Telephone Encounter (Signed)
Cassidy Jennings called on behalf of patient, Patient's blood pressure has been running high . Today's reading was 179/116. The patient is not complaining of any chest pain,  Palpitations, or blurred vision or headaches. Patients caregiver was advised to monitor bp over the weekend  watch salt intake. We will follow up on Monday.

## 2020-02-13 NOTE — Telephone Encounter (Signed)
Please check to see how her blood pressures are doing this week.   Thanks!  CM

## 2020-02-19 ENCOUNTER — Telehealth: Payer: Self-pay | Admitting: Family Medicine

## 2020-02-19 NOTE — Telephone Encounter (Signed)
Ok to schedule as NV.

## 2020-02-19 NOTE — Telephone Encounter (Signed)
Called patient back, spoke with her daughter in law [place].   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   Are you OK if she gets this scheduled on NV? If yes, please fwd to [facility] to schedule.  Thanks!   Note to scheduling team: please contact Estill Bamberg, daughter in law, to get this scheduled. [phone]

## 2020-02-19 NOTE — Telephone Encounter (Signed)
Patient would like to do a TB test.

## 2020-02-20 DIAGNOSIS — Z135 Encounter for screening for eye and ear disorders: Secondary | ICD-10-CM | POA: Diagnosis not present

## 2020-02-20 DIAGNOSIS — H40013 Open angle with borderline findings, low risk, bilateral: Secondary | ICD-10-CM | POA: Diagnosis not present

## 2020-02-20 DIAGNOSIS — H524 Presbyopia: Secondary | ICD-10-CM | POA: Diagnosis not present

## 2020-02-20 DIAGNOSIS — H25813 Combined forms of age-related cataract, bilateral: Secondary | ICD-10-CM | POA: Diagnosis not present

## 2020-02-20 NOTE — Telephone Encounter (Signed)
Please call daughter in law and schedule

## 2020-02-21 NOTE — Telephone Encounter (Signed)
PT SCHEDULED

## 2020-02-26 ENCOUNTER — Other Ambulatory Visit: Payer: Self-pay

## 2020-02-26 ENCOUNTER — Ambulatory Visit (INDEPENDENT_AMBULATORY_CARE_PROVIDER_SITE_OTHER): Payer: Medicare HMO | Admitting: Osteopathic Medicine

## 2020-02-26 VITALS — Temp 98.7°F

## 2020-02-26 DIAGNOSIS — Z111 Encounter for screening for respiratory tuberculosis: Secondary | ICD-10-CM

## 2020-02-26 NOTE — Progress Notes (Signed)
Established Patient Office Visit  Subjective:  Patient ID: Cassidy Jennings, female    DOB: 06/19/1940  Age: 79 y.o. MRN: [id number]  CC:  Chief Complaint  Patient presents with  . PPD Placement    HPI Cassidy Jennings presents for PPD placement.   Past Medical History:  Diagnosis Date  . Chronic kidney disease   . Hyperlipidemia   . Hypertension   . Prediabetes     Past Surgical History:  Procedure Laterality Date  . TUBAL LIGATION      Family History  Problem Relation Age of Onset  . Hypertension Mother   . Heart attack Mother 29  . Heart disease Mother   . Hypertension Sister   . Hypertension Daughter   . Hypertension Son     Social History   Socioeconomic History  . Marital status: Widowed    Spouse name: Not on file  . Number of children: 6  . Years of education: 77  . Highest education level: 12th grade  Occupational History  . Occupation: cleaning lady    Comment: retired  Tobacco Use  . Smoking status: Current Every Day Smoker    Packs/day: 0.25    Years: 63.00    Pack years: 15.75    Types: Cigarettes  . Smokeless tobacco: Never Used  Vaping Use  . Vaping Use: Never used  Substance and Sexual Activity  . Alcohol use: No    Alcohol/week: 0.0 standard drinks  . Drug use: No  . Sexual activity: Not Currently  Other Topics Concern  . Not on file  Social History Narrative   She smokes but has multiple family members that smoke. Stays active but no regular exercise. Walks to mail box and back daily is how she gets her exercise.   Social Determinants of Health   Financial Resource Strain:   . Difficulty of Paying Living Expenses: Not on file  Food Insecurity:   . Worried About Charity fundraiser in the Last Year: Not on file  . Ran Out of Food in the Last Year: Not on file  Transportation Needs:   . Lack of Transportation (Medical): Not on file  . Lack of Transportation (Non-Medical): Not on file  Physical Activity:   . Days of  Exercise per Week: Not on file  . Minutes of Exercise per Session: Not on file  Stress:   . Feeling of Stress : Not on file  Social Connections:   . Frequency of Communication with Friends and Family: Not on file  . Frequency of Social Gatherings with Friends and Family: Not on file  . Attends Religious Services: Not on file  . Active Member of Clubs or Organizations: Not on file  . Attends Archivist Meetings: Not on file  . Marital Status: Not on file  Intimate Partner Violence:   . Fear of Current or Ex-Partner: Not on file  . Emotionally Abused: Not on file  . Physically Abused: Not on file  . Sexually Abused: Not on file    Outpatient Medications Prior to Visit  Medication Sig Dispense Refill  . AMBULATORY NON FORMULARY MEDICATION Medication Name: Glucometer with lancets and strips. Dx diabetes, not on insulin therapy.  Test once a day. Box of 100. 1 Units 0  . amLODipine (NORVASC) 2.5 MG tablet Take 1 tablet (2.5 mg total) by mouth daily. 90 tablet 3  . aspirin 81 MG tablet Take 81 mg by mouth daily.    Marland Kitchen atorvastatin (LIPITOR)  40 MG tablet Take 1 tablet (40 mg total) by mouth daily. 90 tablet 1  . Cholecalciferol (VITAMIN D3) 50 MCG (2000 UT) capsule Take by mouth.     . clopidogrel (PLAVIX) 75 MG tablet Take 1 tablet by mouth daily.    Marland Kitchen donepezil (ARICEPT) 10 MG tablet Take 1 tablet (10 mg total) by mouth at bedtime as needed. 90 tablet 1  . losartan (COZAAR) 100 MG tablet Take 1 tablet (100 mg total) by mouth daily. 90 tablet 0  . MULTIPLE VITAMIN PO Take by mouth.    . pantoprazole (PROTONIX) 40 MG tablet Take 1 tablet (40 mg total) by mouth 2 (two) times daily. 90 tablet 0  . traZODone (DESYREL) 50 MG tablet Take 0.5-1 tablets (25-50 mg total) by mouth at bedtime as needed for sleep. 30 tablet 3   No facility-administered medications prior to visit.    Allergies  Allergen Reactions  . Simvastatin Nausea And Vomiting    gerd gerd    ROS Review of  Systems    Objective:    Physical Exam  Temp 98.7 F (37.1 C) (Oral)  Wt Readings from Last 3 Encounters:  12/14/19 139 lb (63 kg)  11/10/19 129 lb (58.5 kg)  07/14/19 124 lb (56.2 kg)     Health Maintenance Due  Topic Date Due  . Hepatitis C Screening  Never done  . COVID-19 Vaccine (1) Never done  . INFLUENZA VACCINE  12/10/2019    There are no preventive care reminders to display for this patient.  Lab Results  Component Value Date   TSH 1.97 05/16/2019   Lab Results  Component Value Date   WBC 6.1 11/10/2019   HGB 8.7 (L) 11/10/2019   HCT 25.2 (L) 11/10/2019   MCV 95.5 11/10/2019   PLT 290 11/10/2019   Lab Results  Component Value Date   NA 130 (L) 11/10/2019   K 3.8 11/10/2019   CO2 28 11/10/2019   GLUCOSE 120 (H) 11/10/2019   BUN 12 11/10/2019   CREATININE 1.18 (H) 11/10/2019   BILITOT 0.4 11/10/2019   ALKPHOS 67 12/28/2016   AST 15 11/10/2019   ALT 18 11/10/2019   PROT 5.8 (L) 11/10/2019   ALBUMIN 3.9 12/28/2016   CALCIUM 9.1 11/10/2019   Lab Results  Component Value Date   CHOL 165 04/12/2018   Lab Results  Component Value Date   HDL 39 (L) 04/12/2018   Lab Results  Component Value Date   LDLCALC 99 04/12/2018   Lab Results  Component Value Date   TRIG 167 (H) 04/12/2018   Lab Results  Component Value Date   CHOLHDL 4.2 04/12/2018   Lab Results  Component Value Date   HGBA1C 6.4 (A) 04/12/2018      Assessment & Plan:  Screening TB - Placed PPD on left arm without complications. Return on Wednesday for PPD read.    Problem List Items Addressed This Visit    None    Visit Diagnoses    Screening-pulmonary TB    -  Primary   Relevant Orders   PPD      No orders of the defined types were placed in this encounter.   Follow-up: No follow-ups on file.    Lavell Luster, [place]

## 2020-02-28 ENCOUNTER — Ambulatory Visit (INDEPENDENT_AMBULATORY_CARE_PROVIDER_SITE_OTHER): Payer: Medicare HMO | Admitting: Family Medicine

## 2020-02-28 ENCOUNTER — Other Ambulatory Visit: Payer: Self-pay

## 2020-02-28 DIAGNOSIS — Z111 Encounter for screening for respiratory tuberculosis: Secondary | ICD-10-CM

## 2020-02-28 LAB — TB SKIN TEST
Induration: 0 mm
TB Skin Test: NEGATIVE

## 2020-02-28 NOTE — Progress Notes (Signed)
Patient presents today for PPD reading. PPD was placed on 02/26/2020 in left forearm.   Results: negative 0 mm induration

## 2020-03-21 ENCOUNTER — Telehealth: Payer: Self-pay

## 2020-03-21 ENCOUNTER — Other Ambulatory Visit: Payer: Self-pay | Admitting: Family Medicine

## 2020-03-21 DIAGNOSIS — E782 Mixed hyperlipidemia: Secondary | ICD-10-CM

## 2020-03-21 MED ORDER — PANTOPRAZOLE SODIUM 40 MG PO TBEC
40.0000 mg | DELAYED_RELEASE_TABLET | Freq: Every day | ORAL | 1 refills | Status: DC
Start: 2020-03-21 — End: 2020-09-23

## 2020-03-21 NOTE — Telephone Encounter (Signed)
Ms. Elliet Daughter-IN-Law Estill Bamberg lvm stating that something had changed with Ms. Myishia's medication. This medication was picked up on 03/20/20. We have not sent any refills so I'm not sure which medication she is referring to or why it was changed in any way.   LVM for Estill Bamberg to callback with additional information.

## 2020-04-02 ENCOUNTER — Other Ambulatory Visit: Payer: Self-pay

## 2020-04-02 MED ORDER — AMLODIPINE BESYLATE 5 MG PO TABS
5.0000 mg | ORAL_TABLET | Freq: Every day | ORAL | 1 refills | Status: DC
Start: 2020-04-02 — End: 2020-12-06

## 2020-04-02 NOTE — Telephone Encounter (Signed)
Per Dr. Zigmund Daniel, amlodipine was increased to 5mg  daily.   Pharmacy will not refill Rx without speaking to physician or new physician's order.  Please advise.

## 2020-04-09 DIAGNOSIS — H401133 Primary open-angle glaucoma, bilateral, severe stage: Secondary | ICD-10-CM | POA: Diagnosis not present

## 2020-06-11 DIAGNOSIS — H401133 Primary open-angle glaucoma, bilateral, severe stage: Secondary | ICD-10-CM | POA: Diagnosis not present

## 2020-06-11 DIAGNOSIS — H2513 Age-related nuclear cataract, bilateral: Secondary | ICD-10-CM | POA: Diagnosis not present

## 2020-06-24 DIAGNOSIS — N1831 Chronic kidney disease, stage 3a: Secondary | ICD-10-CM | POA: Diagnosis not present

## 2020-06-24 DIAGNOSIS — I701 Atherosclerosis of renal artery: Secondary | ICD-10-CM | POA: Diagnosis not present

## 2020-06-24 DIAGNOSIS — R69 Illness, unspecified: Secondary | ICD-10-CM | POA: Diagnosis not present

## 2020-06-24 DIAGNOSIS — R4189 Other symptoms and signs involving cognitive functions and awareness: Secondary | ICD-10-CM | POA: Diagnosis not present

## 2020-06-29 ENCOUNTER — Other Ambulatory Visit: Payer: Self-pay | Admitting: Family Medicine

## 2020-07-18 DIAGNOSIS — I739 Peripheral vascular disease, unspecified: Secondary | ICD-10-CM | POA: Diagnosis not present

## 2020-07-18 DIAGNOSIS — I701 Atherosclerosis of renal artery: Secondary | ICD-10-CM | POA: Diagnosis not present

## 2020-07-18 DIAGNOSIS — I6523 Occlusion and stenosis of bilateral carotid arteries: Secondary | ICD-10-CM | POA: Diagnosis not present

## 2020-07-26 DIAGNOSIS — I6523 Occlusion and stenosis of bilateral carotid arteries: Secondary | ICD-10-CM | POA: Diagnosis not present

## 2020-07-26 DIAGNOSIS — I701 Atherosclerosis of renal artery: Secondary | ICD-10-CM | POA: Diagnosis not present

## 2020-07-26 DIAGNOSIS — R69 Illness, unspecified: Secondary | ICD-10-CM | POA: Diagnosis not present

## 2020-07-26 DIAGNOSIS — I739 Peripheral vascular disease, unspecified: Secondary | ICD-10-CM | POA: Diagnosis not present

## 2020-08-16 DIAGNOSIS — K219 Gastro-esophageal reflux disease without esophagitis: Secondary | ICD-10-CM | POA: Diagnosis not present

## 2020-08-16 DIAGNOSIS — D649 Anemia, unspecified: Secondary | ICD-10-CM | POA: Diagnosis not present

## 2020-08-16 DIAGNOSIS — K226 Gastro-esophageal laceration-hemorrhage syndrome: Secondary | ICD-10-CM | POA: Diagnosis not present

## 2020-08-16 DIAGNOSIS — D509 Iron deficiency anemia, unspecified: Secondary | ICD-10-CM | POA: Diagnosis not present

## 2020-08-16 DIAGNOSIS — K279 Peptic ulcer, site unspecified, unspecified as acute or chronic, without hemorrhage or perforation: Secondary | ICD-10-CM | POA: Diagnosis not present

## 2020-09-18 ENCOUNTER — Ambulatory Visit (INDEPENDENT_AMBULATORY_CARE_PROVIDER_SITE_OTHER): Payer: Medicare HMO

## 2020-09-18 ENCOUNTER — Ambulatory Visit (INDEPENDENT_AMBULATORY_CARE_PROVIDER_SITE_OTHER): Payer: Medicare HMO | Admitting: Family Medicine

## 2020-09-18 ENCOUNTER — Other Ambulatory Visit: Payer: Self-pay

## 2020-09-18 ENCOUNTER — Encounter: Payer: Self-pay | Admitting: Family Medicine

## 2020-09-18 VITALS — BP 148/85 | HR 84 | Ht 64.0 in | Wt 119.1 lb

## 2020-09-18 DIAGNOSIS — M25561 Pain in right knee: Secondary | ICD-10-CM

## 2020-09-18 DIAGNOSIS — W19XXXA Unspecified fall, initial encounter: Secondary | ICD-10-CM | POA: Diagnosis not present

## 2020-09-18 DIAGNOSIS — R69 Illness, unspecified: Secondary | ICD-10-CM | POA: Diagnosis not present

## 2020-09-18 DIAGNOSIS — I10 Essential (primary) hypertension: Secondary | ICD-10-CM

## 2020-09-18 DIAGNOSIS — Z720 Tobacco use: Secondary | ICD-10-CM

## 2020-09-18 DIAGNOSIS — G301 Alzheimer's disease with late onset: Secondary | ICD-10-CM | POA: Diagnosis not present

## 2020-09-18 DIAGNOSIS — R634 Abnormal weight loss: Secondary | ICD-10-CM

## 2020-09-18 DIAGNOSIS — N1831 Chronic kidney disease, stage 3a: Secondary | ICD-10-CM | POA: Diagnosis not present

## 2020-09-18 DIAGNOSIS — M25461 Effusion, right knee: Secondary | ICD-10-CM

## 2020-09-18 DIAGNOSIS — F0280 Dementia in other diseases classified elsewhere without behavioral disturbance: Secondary | ICD-10-CM

## 2020-09-18 NOTE — Patient Instructions (Signed)
Have xray and labs completed.  Try tylenol arthritis as needed.  Apply ice to the knee.

## 2020-09-19 LAB — COMPLETE METABOLIC PANEL WITH GFR
AG Ratio: 1.2 (calc) (ref 1.0–2.5)
ALT: 14 U/L (ref 6–29)
AST: 16 U/L (ref 10–35)
Albumin: 3.9 g/dL (ref 3.6–5.1)
Alkaline phosphatase (APISO): 78 U/L (ref 37–153)
BUN/Creatinine Ratio: 17 (calc) (ref 6–22)
BUN: 24 mg/dL (ref 7–25)
CO2: 30 mmol/L (ref 20–32)
Calcium: 9.5 mg/dL (ref 8.6–10.4)
Chloride: 105 mmol/L (ref 98–110)
Creat: 1.42 mg/dL — ABNORMAL HIGH (ref 0.60–0.93)
GFR, Est African American: 41 mL/min/{1.73_m2} — ABNORMAL LOW (ref >=60)
GFR, Est Non African American: 35 mL/min/{1.73_m2} — ABNORMAL LOW (ref >=60)
Globulin: 3.2 g/dL (calc) (ref 1.9–3.7)
Glucose, Bld: 96 mg/dL (ref 65–99)
Potassium: 4 mmol/L (ref 3.5–5.3)
Sodium: 142 mmol/L (ref 135–146)
Total Bilirubin: 0.7 mg/dL (ref 0.2–1.2)
Total Protein: 7.1 g/dL (ref 6.1–8.1)

## 2020-09-19 LAB — CBC WITH DIFFERENTIAL/PLATELET
Absolute Monocytes: 310 cells/uL (ref 200–950)
Basophils Absolute: 28 cells/uL (ref 0–200)
Basophils Relative: 0.6 %
Eosinophils Absolute: 89 cells/uL (ref 15–500)
Eosinophils Relative: 1.9 %
HCT: 37.6 % (ref 35.0–45.0)
Hemoglobin: 12.6 g/dL (ref 11.7–15.5)
Lymphs Abs: 1175 cells/uL (ref 850–3900)
MCH: 31.8 pg (ref 27.0–33.0)
MCHC: 33.5 g/dL (ref 32.0–36.0)
MCV: 94.9 fL (ref 80.0–100.0)
MPV: 10.7 fL (ref 7.5–12.5)
Monocytes Relative: 6.6 %
Neutro Abs: 3097 cells/uL (ref 1500–7800)
Neutrophils Relative %: 65.9 %
Platelets: 210 10*3/uL (ref 140–400)
RBC: 3.96 10*6/uL (ref 3.80–5.10)
RDW: 12.1 % (ref 11.0–15.0)
Total Lymphocyte: 25 %
WBC: 4.7 10*3/uL (ref 3.8–10.8)

## 2020-09-19 LAB — URINALYSIS, ROUTINE W REFLEX MICROSCOPIC
Bilirubin Urine: NEGATIVE
Glucose, UA: NEGATIVE
Hgb urine dipstick: NEGATIVE
Hyaline Cast: NONE SEEN /LPF
Ketones, ur: NEGATIVE
Nitrite: NEGATIVE
Specific Gravity, Urine: 1.022 (ref 1.001–1.035)
pH: 5.5 (ref 5.0–8.0)

## 2020-09-19 LAB — MICROSCOPIC MESSAGE

## 2020-09-19 LAB — TSH: TSH: 2.38 mIU/L (ref 0.40–4.50)

## 2020-09-19 LAB — URINE CULTURE
MICRO NUMBER:: 11878501
Result:: NO GROWTH
SPECIMEN QUALITY:: ADEQUATE

## 2020-09-22 ENCOUNTER — Other Ambulatory Visit: Payer: Self-pay | Admitting: Family Medicine

## 2020-09-22 DIAGNOSIS — M25561 Pain in right knee: Secondary | ICD-10-CM | POA: Insufficient documentation

## 2020-09-22 NOTE — Progress Notes (Signed)
Cassidy Jennings - 80 y.o. female MRN [id number]  Date of birth: 10-20-1940  Subjective Chief Complaint  Patient presents with  . Fall    HPI Cassidy Jennings is a 80 y.o. female here today with complaint of fall.  She tripped at home and had fall injuring her R knee a couple of days ago.  She is able to bear weight but knee is swollen and tender to touch on the inside of the knee.  They have tried heat but have not tried anything else to help with this.    She does also have a history of alzheimers dementia.  Family reports some recent decline and that she is more forgetful.  She was taking aricept however they had stopped this.  The would be interested in restarting this.   She denies any urinary symptoms at this time including dysuria or frequency.  Her appetite is diminished some and weight is down about 20 lbs since 8/21.    ROS:  A comprehensive ROS was completed and negative except as noted per HPI  Allergies  Allergen Reactions  . Simvastatin Nausea And Vomiting    gerd gerd    Past Medical History:  Diagnosis Date  . Chronic kidney disease   . Hyperlipidemia   . Hypertension   . Prediabetes     Past Surgical History:  Procedure Laterality Date  . TUBAL LIGATION      Social History   Socioeconomic History  . Marital status: Widowed    Spouse name: Not on file  . Number of children: 6  . Years of education: 36  . Highest education level: 12th grade  Occupational History  . Occupation: cleaning lady    Comment: retired  Tobacco Use  . Smoking status: Current Every Day Smoker    Packs/day: 0.25    Years: 63.00    Pack years: 15.75    Types: Cigarettes  . Smokeless tobacco: Never Used  Vaping Use  . Vaping Use: Never used  Substance and Sexual Activity  . Alcohol use: No    Alcohol/week: 0.0 standard drinks  . Drug use: No  . Sexual activity: Not Currently  Other Topics Concern  . Not on file  Social History Narrative   She smokes but has multiple  family members that smoke. Stays active but no regular exercise. Walks to mail box and back daily is how she gets her exercise.   Social Determinants of Health   Financial Resource Strain: Not on file  Food Insecurity: Not on file  Transportation Needs: Not on file  Physical Activity: Not on file  Stress: Not on file  Social Connections: Not on file    Family History  Problem Relation Age of Onset  . Hypertension Mother   . Heart attack Mother 32  . Heart disease Mother   . Hypertension Sister   . Hypertension Daughter   . Hypertension Son     Health Maintenance  Topic Date Due  . COVID-19 Vaccine (1) Never done  . Hepatitis C Screening  Never done  . INFLUENZA VACCINE  12/09/2020  . TETANUS/TDAP  04/20/2021  . DEXA SCAN  Completed  . PNA vac Low Risk Adult  Completed  . HPV VACCINES  Aged Out     ----------------------------------------------------------------------------------------------------------------------------------------------------------------------------------------------------------------- Physical Exam BP (!) 148/85 (BP Location: Left Arm, Patient Position: Sitting, Cuff Size: Normal)   Pulse 84   Ht '5\' 4"'$  (1.626 m)   Wt 119 lb 1.9 oz (54 kg)  SpO2 99%   BMI 20.45 kg/m   Physical Exam Constitutional:      Appearance: Normal appearance.  Eyes:     General: No scleral icterus. Cardiovascular:     Rate and Rhythm: Normal rate and regular rhythm.  Pulmonary:     Effort: Pulmonary effort is normal.     Breath sounds: Normal breath sounds.  Musculoskeletal:     Cervical back: Neck supple.     Comments: R knee with ttp and swelling medially.  Patella is non-tender.  Negative lachman.  No laxity noted of MCL/LCL.    Neurological:     General: No focal deficit present.     Mental Status: She is alert and oriented to person, place, and time.  Psychiatric:        Mood and Affect: Mood normal.        Behavior: Behavior normal.      ------------------------------------------------------------------------------------------------------------------------------------------------------------------------------------------------------------------- Assessment and Plan  Hypertension Mild elevation in blood pressure.  Continue current medications.   Alzheimer's dementia [facility]) She is seeing neurology.  Memory had been stable and family had d/c'ed aricept however with recent decline they would like to restart this.  Will check for any organic causes of her recent decline including thyroid function, metabolic panel and UA.    CKD (chronic kidney disease) stage 3, GFR 30-59 ml/min Update renal function.    Tobacco abuse Counseling provided however she continues to decline intervention.    Weight loss Checking metabolic panel and thyroid function.  WE can consider addition of mirtazapine if this continues.    Acute pain of right knee R knee with pain and swelling.  Able ot bear weight.   Xrays ordered  Recommend icing and tylenol arthritis for pain.  We may ultimately need to obtain MRI if not improving.     No orders of the defined types were placed in this encounter.   No follow-ups on file.    This visit occurred during the SARS-CoV-2 public health emergency.  Safety protocols were in place, including screening questions prior to the visit, additional usage of staff PPE, and extensive cleaning of exam room while observing appropriate contact time as indicated for disinfecting solutions.

## 2020-09-22 NOTE — Assessment & Plan Note (Signed)
She is seeing neurology.  Memory had been stable and family had d/c'ed aricept however with recent decline they would like to restart this.  Will check for any organic causes of her recent decline including thyroid function, metabolic panel and UA.

## 2020-09-22 NOTE — Assessment & Plan Note (Signed)
Mild elevation in blood pressure.  Continue current medications.

## 2020-09-22 NOTE — Assessment & Plan Note (Signed)
Counseling provided however she continues to decline intervention.

## 2020-09-22 NOTE — Assessment & Plan Note (Signed)
R knee with pain and swelling.  Able ot bear weight.   Xrays ordered  Recommend icing and tylenol arthritis for pain.  We may ultimately need to obtain MRI if not improving.

## 2020-09-22 NOTE — Assessment & Plan Note (Signed)
Checking metabolic panel and thyroid function.  WE can consider addition of mirtazapine if this continues.

## 2020-09-22 NOTE — Assessment & Plan Note (Signed)
Update renal function.  

## 2020-09-23 ENCOUNTER — Other Ambulatory Visit: Payer: Self-pay | Admitting: Family Medicine

## 2020-09-23 DIAGNOSIS — G301 Alzheimer's disease with late onset: Secondary | ICD-10-CM

## 2020-09-23 DIAGNOSIS — F0280 Dementia in other diseases classified elsewhere without behavioral disturbance: Secondary | ICD-10-CM

## 2020-09-23 MED ORDER — DONEPEZIL HCL 10 MG PO TABS
10.0000 mg | ORAL_TABLET | Freq: Every evening | ORAL | 1 refills | Status: DC | PRN
Start: 2020-09-23 — End: 2021-06-16

## 2020-09-26 ENCOUNTER — Telehealth: Payer: Self-pay | Admitting: Family Medicine

## 2020-09-26 NOTE — Chronic Care Management (AMB) (Signed)
  Chronic Care Management   Outreach Note  09/26/2020 Name: Cassidy Jennings MRN: [id number] DOB: 04-07-1941  Referred by: [name], DO Reason for referral : No chief complaint on file.   An unsuccessful telephone outreach was attempted today. The patient was referred to the pharmacist for assistance with care management and care coordination.   Follow Up Plan:   SIGNATURE

## 2020-10-10 ENCOUNTER — Telehealth: Payer: Self-pay | Admitting: Family Medicine

## 2020-10-10 NOTE — Chronic Care Management (AMB) (Signed)
  Chronic Care Management   Note  10/10/2020 Name: Cassidy Jennings MRN: [id number] DOB: 15-May-1940  [name] is a 80 y.o. year old female who is a primary care patient of [name], DO. I reached out to [employer] by phone today in response to a referral sent by [name]'s PCP, [name], DO.   [name] was given information about Chronic Care Management services today including:  1. CCM service includes personalized support from designated clinical staff supervised by her physician, including individualized plan of care and coordination with other care providers 2. 24/7 contact phone numbers for assistance for urgent and routine care needs. 3. Service will only be billed when office clinical staff spend 20 minutes or more in a month to coordinate care. 4. Only one practitioner may furnish and bill the service in a calendar month. 5. The patient may stop CCM services at any time (effective at the end of the month) by phone call to the office staff.   Patient agreed to services and verbal consent obtained.   Follow up plan:   Lauretta Grill [employer] Scheduler

## 2020-11-21 ENCOUNTER — Telehealth: Payer: Medicare HMO

## 2020-12-06 ENCOUNTER — Other Ambulatory Visit: Payer: Self-pay

## 2020-12-06 MED ORDER — AMLODIPINE BESYLATE 5 MG PO TABS: 5.0000 mg | ORAL_TABLET | Freq: Every day | ORAL | 1 refills | Status: DC

## 2020-12-12 ENCOUNTER — Other Ambulatory Visit: Payer: Self-pay

## 2020-12-12 MED ORDER — AMLODIPINE BESYLATE 5 MG PO TABS: 5.0000 mg | ORAL_TABLET | Freq: Every day | ORAL | 1 refills | Status: DC

## 2020-12-20 ENCOUNTER — Telehealth: Payer: Self-pay | Admitting: Pharmacist

## 2020-12-20 NOTE — Chronic Care Management (AMB) (Signed)
    Chronic Care Management Pharmacy Assistant   Name: Cassidy Jennings  MRN: [id number] DOB: 1940/12/28  Cassidy Jennings is an 80 y.o. year old female who presents for his initial CCM visit with the clinical pharmacist.  Recent office visits:  09/18/20-Cody Zigmund Daniel, DO (PCP) Seen due to a fall. X-rays ordered.  Recommend icing and tylenol arthritis for pain.  Recent consult visits:  08/16/20-Karl A. Pleasant (Gastroenterology) Seen for Anemia. Labs ordered. Follow up in 1 year. 07/26/20-Shawn H. Raul Del (Vascular Specialist) Notes not available. 07/18/20-Claude Mellody Dance (Vascular Surgery) Notes not available. 06/24/20-John Lenon Curt (Neurology) Patient evaluation. Follow up in 6 months.  Hospital visits:  None in previous 6 months  Medications: Outpatient Encounter Medications as of 12/20/2020  Medication Sig   AMBULATORY NON FORMULARY MEDICATION Medication Name: Glucometer with lancets and strips. Dx diabetes, not on insulin therapy.  Test once a day. Box of 100.   amLODipine (NORVASC) 5 MG tablet Take 1 tablet (5 mg total) by mouth daily.   aspirin 81 MG tablet Take 81 mg by mouth daily.   atorvastatin (LIPITOR) 40 MG tablet Take 1 tablet (40 mg total) by mouth daily.   Cholecalciferol (VITAMIN D3) 50 MCG (2000 UT) capsule Take by mouth.    clopidogrel (PLAVIX) 75 MG tablet Take 1 tablet by mouth daily.   donepezil (ARICEPT) 10 MG tablet Take 1 tablet (10 mg total) by mouth at bedtime as needed.   latanoprost (XALATAN) 0.005 % ophthalmic solution 1 drop at bedtime.   losartan (COZAAR) 100 MG tablet TAKE 1 TABLET(100 MG) BY MOUTH DAILY   MULTIPLE VITAMIN PO Take by mouth.   pantoprazole (PROTONIX) 40 MG tablet TAKE 1 TABLET(40 MG) BY MOUTH DAILY   traZODone (DESYREL) 50 MG tablet Take 0.5-1 tablets (25-50 mg total) by mouth at bedtime as needed for sleep.   No facility-administered encounter medications on file as of 12/20/2020.   AmLODipine (NORVASC) 5 MG tablet Last  filled:12/06/20 90 DS Aspirin 81 MG tablet Last filled:None noted Atorvastatin (LIPITOR) 40 MG tablet Last filled:12/25/19 90 DS Cholecalciferol (VITAMIN D3) 50 MCG (2000 UT) capsule Last filled:None noted Clopidogrel (PLAVIX) 75 MG tablet Last filled:11/26/19 90 DS Donepezil (ARICEPT) 10 MG tablet Last filled:09/23/20 90 DS Latanoprost (XALATAN) 0.005 % ophthalmic solution Last filled:12/01/20 70 DS Losartan (COZAAR) 100 MG tablet Last filled:06/28/20 10 DS Pantoprazole (PROTONIX) 40 MG tablet Last filled:12/13/20 90 DS TraZODone (DESYREL) 50 MG tablet Last filled:12/27/19 30 DS    Star Rating Drugs: Losartan (COZAAR) 100 MG tablet Last filled:06/28/20 10 DS Atorvastatin (LIPITOR) 40 MG tablet Last filled:12/25/19 90 DS  Cassidy Jennings, Cassidy Jennings

## 2020-12-24 DIAGNOSIS — R4189 Other symptoms and signs involving cognitive functions and awareness: Secondary | ICD-10-CM | POA: Diagnosis not present

## 2020-12-24 DIAGNOSIS — I701 Atherosclerosis of renal artery: Secondary | ICD-10-CM | POA: Diagnosis not present

## 2020-12-24 DIAGNOSIS — R69 Illness, unspecified: Secondary | ICD-10-CM | POA: Diagnosis not present

## 2020-12-24 DIAGNOSIS — I6523 Occlusion and stenosis of bilateral carotid arteries: Secondary | ICD-10-CM | POA: Diagnosis not present

## 2021-01-01 ENCOUNTER — Ambulatory Visit (INDEPENDENT_AMBULATORY_CARE_PROVIDER_SITE_OTHER): Payer: Medicare HMO | Admitting: Pharmacist

## 2021-01-01 ENCOUNTER — Other Ambulatory Visit: Payer: Self-pay

## 2021-01-01 DIAGNOSIS — I10 Essential (primary) hypertension: Secondary | ICD-10-CM

## 2021-01-01 DIAGNOSIS — E782 Mixed hyperlipidemia: Secondary | ICD-10-CM

## 2021-01-01 DIAGNOSIS — K219 Gastro-esophageal reflux disease without esophagitis: Secondary | ICD-10-CM

## 2021-01-01 NOTE — Progress Notes (Signed)
Chronic Care Management Pharmacy Note  01/02/2021 Name:  Cassidy Jennings MRN:  [id number] DOB:  10/10/1940  Summary: addressed HTN, HLD. Patient not filling/taking losartan or atorvastatin.   Recommendations/Changes made from today's visit:  - Increase amlodipine to 87m daily, family to check patient BP once weekly.  -Recommend patient get scheduled with PCP for annual well check, repeat lipid panel. Discontinue statin at this time - Recommend discontinue pantoprazole (patient is without symptoms or significant GI history), to trial off of PPI and avoid adverse effects with long-term use.  Plan: f/u with pharmacist in 1 month to review changes  Subjective: Cassidy MSUZI HERNANis an 80y.o. year old female who is a primary patient of Cassidy Nutting DO.  The CCM team was consulted for assistance with disease management and care coordination needs.    Engaged with patient by telephone for initial visit in response to provider referral for pharmacy case management and/or care coordination services.   Consent to Services:  The patient was given information about Chronic Care Management services, agreed to services, and gave verbal consent prior to initiation of services.  Please see initial visit note for detailed documentation.   Patient Care Team: Cassidy Nutting DO as PCP - General (Family Medicine) KDarius Bump RReg[facility], LLCas Pharmacist (Pharmacist)  Recent office visits:  09/18/20-Cody MZigmund Daniel DO (PCP) Seen due to a fall. X-rays ordered.  Recommend icing and tylenol arthritis for pain.   Recent consult visits:  08/16/20-Karl A. Pleasant (Gastroenterology) Seen for Anemia. Labs ordered. Follow up in 1 year. 07/26/20-Shawn H. FRaul Del(Vascular Specialist) Notes not available. 07/18/20-Claude RMellody Dance(Vascular Surgery) Notes not available. 06/24/20-John CLenon Curt(Neurology) Patient evaluation. Follow up in 6 months.   Hospital visits:  None in previous 6  months  Objective:  Lab Results  Component Value Date   CREATININE 1.42 (H) 09/18/2020   CREATININE 1.18 (H) 11/10/2019   CREATININE 1.73 (H) 04/12/2018    Lab Results  Component Value Date   HGBA1C 6.4 (A) 04/12/2018       Component Value Date/Time   CHOL 165 04/12/2018 1100   TRIG 167 (H) 04/12/2018 1100   HDL 39 (L) 04/12/2018 1100   CHOLHDL 4.2 04/12/2018 1100   VLDL 29 09/02/2015 1025   LDLCALC 99 04/12/2018 1100   LDLDIRECT 127 (H) 11/17/2007 1538    Hepatic Function Latest Ref Rng & Units 09/18/2020 11/10/2019 05/16/2019  Total Protein 6.1 - 8.1 g/dL 7.1 5.8(L) 7.0  Albumin 3.6 - 5.1 g/dL - - -  AST 10 - 35 U/L 16 15 -  ALT 6 - 29 U/L 14 18 -  Alk Phosphatase 33 - 130 U/L - - -  Total Bilirubin 0.2 - 1.2 mg/dL 0.7 0.4 -    Lab Results  Component Value Date/Time   TSH 2.38 09/18/2020 12:36 PM   TSH 1.97 05/16/2019 03:47 PM    CBC Latest Ref Rng & Units 09/18/2020 11/10/2019 05/16/2019  WBC 3.8 - 10.8 Thousand/uL 4.7 6.1 6.8  Hemoglobin 11.7 - 15.5 g/dL 12.6 8.7(L) 11.7  Hematocrit 35.0 - 45.0 % 37.6 25.2(L) 34.1(L)  Platelets 140 - 400 Thousand/uL 210 290 226    Lab Results  Component Value Date/Time   VD25OH 62 05/16/2019 03:47 PM   VD25OH 60 08/05/2016 10:22 AM     Social History   Tobacco Use  Smoking Status Every Day   Packs/day: 0.25   Years: 63.00   Pack years: 15.75   Types: Cigarettes  Smokeless Tobacco Never  BP Readings from Last 3 Encounters:  09/18/20 (!) 148/85  12/14/19 139/68  11/10/19 (!) 147/61   Pulse Readings from Last 3 Encounters:  09/18/20 84  12/14/19 97  11/10/19 85   Wt Readings from Last 3 Encounters:  09/18/20 119 lb 1.9 oz (54 kg)  12/14/19 139 lb (63 kg)  11/10/19 129 lb (58.5 kg)    Assessment: Review of patient past medical history, allergies, medications, health status, including review of consultants reports, laboratory and other test data, was performed as part of comprehensive evaluation and provision of  chronic care management services.   SDOH:  (Social Determinants of Health) assessments and interventions performed:    CCM Care Plan  Allergies  Allergen Reactions   Simvastatin Nausea And Vomiting    gerd gerd    Medications Reviewed Today     Reviewed by Darius Bump, [facility] (Pharmacist) on 01/01/21 at 0925  Med List Status: <None>   Medication Order Taking? Sig Documenting Provider Last Dose Status Informant  amLODipine (NORVASC) 5 MG tablet [id number] Yes Take 1 tablet (5 mg total) by mouth daily. [name], DO Taking Active   aspirin 81 MG tablet [id number] Yes Take 81 mg by mouth daily. [provider] Taking Active   atorvastatin (LIPITOR) 40 MG tablet [id number] No Take 1 tablet (40 mg total) by mouth daily.  Patient not taking: Reported on 01/01/2021   [name], DO Not Taking Active   Cholecalciferol (VITAMIN D3) 50 MCG (2000 UT) capsule [id number] Yes Take by mouth.  [provider] Taking Active   clopidogrel (PLAVIX) 75 MG tablet [id number] No Take 1 tablet by mouth daily.  Patient not taking: Reported on 01/01/2021   [provider] Not Taking Active   donepezil (ARICEPT) 10 MG tablet [id number] Yes Take 1 tablet (10 mg total) by mouth at bedtime as needed. [name], DO Taking Active   latanoprost (XALATAN) 0.005 % ophthalmic solution [id number] Yes 1 drop at bedtime. [provider] Taking Active   losartan (COZAAR) 100 MG tablet [id number] No TAKE 1 TABLET(100 MG) BY MOUTH DAILY  Patient not taking: Reported on 01/01/2021   [name], DO Not Taking Active   MULTIPLE VITAMIN PO [id number] Yes Take by mouth. [provider] Taking Active   pantoprazole (PROTONIX) 40 MG tablet [id number] Yes TAKE 1 TABLET(40 MG) BY MOUTH DAILY [name], DO Taking Active   traZODone (DESYREL) 50 MG tablet [id number] No Take 0.5-1 tablets (25-50 mg total) by mouth at bedtime as needed for sleep.  Patient not taking: Reported on  01/01/2021   [name], DO Not Taking Active             Patient Active Problem List   Diagnosis Date Noted   Acute pain of right knee 09/22/2020   Venous insufficiency 12/14/2019   Blood loss anemia 11/10/2019   Mallory-Weiss tear 11/06/2019   Acute hyponatremia 11/04/2019   Renal artery stenosis ([place]) 12/29/2016   Osteopenia 09/23/2016   Post-menopausal 08/05/2016   PAD (peripheral artery disease) ([place]) 01/11/2015   CKD (chronic kidney disease) stage 3, GFR 30-59 ml/min ([place]) 01/01/2015   Weight loss 12/31/2014   Carotid artery disease ([place]) 08/01/2014   Tobacco abuse 07/13/2014   Prediabetes 09/12/2012   Cataract 05/10/2012   Glaucoma 05/10/2012   Hypertension    Hyperlipidemia    Alzheimer's dementia ([place]) 08/09/2009   PAP SMEAR, ABNORMAL 03/21/2009   GERD 03/19/2009   Hearing loss 11/07/2007    Immunization History  Administered  Date(s) Administered   Influenza Split 03/22/2011, 03/31/2012   Influenza Whole 03/19/2009, 04/17/2010   Influenza, High Dose Seasonal PF 03/30/2019   Influenza,inj,Quad PF,6+ Mos 01/31/2015, 01/13/2017   Influenza-Unspecified 05/12/2015, 03/11/2018   PPD Test 02/26/2020   Pneumococcal Conjugate-13 01/31/2015   Pneumococcal Polysaccharide-23 04/21/2011   Tdap 04/21/2011   Zoster, Live 04/10/2012    Conditions to be addressed/monitored: HTN, HLD, and GERD  Care Plan : Medication Management  Updates made by Darius Bump, [place] since 01/02/2021 12:00 AM     Problem: HTN, HLD, GERD      Long-Range Goal: Disease Progression Prevention   Start Date: 01/01/2021  This Visit's Progress: On track  Priority: High  Note:   Current Barriers:  Unable to self administer medications as prescribed, medications are organized, maintained, and provided to patient on daily basis by Einar Grad, daughter in law.  Pharmacist Clinical Goal(s):  Over the next 30 days, patient will adhere to plan to optimize therapeutic regimen for  chronic conditions as evidenced by report of adherence to recommended medication management changes through collaboration with PharmD and provider.   Interventions: 1:1 collaboration with [name], DO regarding development and update of comprehensive plan of care as evidenced by provider attestation and co-signature Inter-disciplinary care team collaboration (see longitudinal plan of care) Comprehensive medication review performed; medication list updated in electronic medical record  Hypertension:  Uncontrolled; current treatment: amlodipine 52m (not filling/taking losartan);   Current home readings: 148/90, SBP 190 one time, daughter in law states this is related to stress when other children call her on phone  Denies hypotensive/hypertensive symptoms  Counseled on proper technique for taking BP at home Recommended increase amlodipine to 189mdaily  Collaborated with PCP on utility of ACEI/ARB in CKD, unilateral renal artery stenosis. May consider for renal protection if further BP lowering is needed, although at this time prefer to optimize amlodipine prior to multiple medications. Hyperlipidemia:  Controlled; current treatment: not filling/taking atorvastatin 4068mLDL 99  Recommended patient to schedule PCP appt for annual/general follow up, repeat lipid panel (last check 2019), and at this time DISCONTINUE atorvastatin as patient is not filling it and relatively controlled per 2019 labs. GERD:   Controlled; current treatment: pantoprazole 56m16mily  Recommended discontinue pantoprazole and trial off of PPI to avoid adverse effects with long-term use. Patient does not have significant GI history and is without complaint of any GERD symptoms.  Patient Goals/Self-Care Activities Over the next 30 days, patient will:  focus on medication adherence by setting up appt with PCP for annual well check and check blood pressure weekly, document, and provide at future appointments  Follow Up  Plan: Telephone follow up appointment with care management team member scheduled for:  1 month      Medication Assistance: None required.  Patient affirms current coverage meets needs.  [id number] [place]: [phone]: [phone] pill box? Yes - daughter in law manages, and administers doses. Lives with son & daughter in law. Pt endorses 100% compliance  Follow Up:  Patient agrees to Care Plan and Follow-up.  Plan: Telephone follow up appointment with care management team member scheduled for:  1 month  KeesDarius Bump

## 2021-01-02 NOTE — Patient Instructions (Signed)
Visit Information   PATIENT GOALS:   Goals Addressed             This Visit's Progress    Medication Management       Patient Goals/Self-Care Activities Over the next 30 days, patient will:  focus on medication adherence by setting up appt with PCP for annual well check and check blood pressure weekly, document, and provide at future appointments  Follow Up Plan: Telephone follow up appointment with care management team member scheduled for:  1 month         Consent to CCM Services: Cassidy Jennings was given information about Chronic Care Management services including:  CCM service includes personalized support from designated clinical staff supervised by her physician, including individualized plan of care and coordination with other care providers 24/7 contact phone numbers for assistance for urgent and routine care needs. Service will only be billed when office clinical staff spend 20 minutes or more in a month to coordinate care. Only one practitioner may furnish and bill the service in a calendar month. The patient may stop CCM services at any time (effective at the end of the month) by phone call to the office staff. The patient will be responsible for cost sharing (co-pay) of up to 20% of the service fee (after annual deductible is met).  Patient agreed to services and verbal consent obtained.   Patient verbalizes understanding of instructions provided today and agrees to view in [place].   Telephone follow up appointment with care management team member scheduled for: 1 month  [place]: Patient Care Plan: Medication Management     Problem Identified: HTN, HLD, GERD      Long-Range Goal: Disease Progression Prevention   Start Date: 01/01/2021  This Visit's Progress: On track  Priority: High  Note:   Current Barriers:  Unable to self administer medications as prescribed, medications are organized, maintained, and provided to patient on  daily basis by Einar Grad, daughter in law.  Pharmacist Clinical Goal(s):  Over the next 30 days, patient will adhere to plan to optimize therapeutic regimen for chronic conditions as evidenced by report of adherence to recommended medication management changes through collaboration with PharmD and provider.   Interventions: 1:1 collaboration with [name], DO regarding development and update of comprehensive plan of care as evidenced by provider attestation and co-signature Inter-disciplinary care team collaboration (see longitudinal plan of care) Comprehensive medication review performed; medication list updated in electronic medical record  Hypertension:  Uncontrolled; current treatment: amlodipine 18m (not filling/taking losartan);   Current home readings: 148/90, SBP 190 one time, daughter in law states this is related to stress when other children call her on phone  Denies hypotensive/hypertensive symptoms  Counseled on proper technique for taking BP at home Recommended increase amlodipine to 134mdaily  Collaborated with PCP on utility of ACEI/ARB in CKD, unilateral renal artery stenosis. May consider for renal protection if further BP lowering is needed, although at this time prefer to optimize amlodipine prior to multiple medications. Hyperlipidemia:  Controlled; current treatment: not filling/taking atorvastatin 4034mLDL 99  Recommended patient to schedule PCP appt for annual/general follow up, repeat lipid panel (last check 2019), and at this time DISCONTINUE atorvastatin as patient is not filling it and relatively controlled per 2019 labs. GERD:   Controlled; current treatment: pantoprazole 43m13mily  Recommended discontinue pantoprazole and trial off of PPI to avoid adverse effects with long-term use. Patient does not have significant  GI history and is without complaint of any GERD symptoms.  Patient Goals/Self-Care Activities Over the next 30 days, patient will:   focus on medication adherence by setting up appt with PCP for annual well check and check blood pressure weekly, document, and provide at future appointments  Follow Up Plan: Telephone follow up appointment with care management team member scheduled for:  1 month

## 2021-01-03 ENCOUNTER — Telehealth: Payer: Self-pay

## 2021-01-03 NOTE — Telephone Encounter (Signed)
Disability placard form completed and placed in [employer].   Please call Gayl Anschutz to pick-up form.

## 2021-01-03 NOTE — Telephone Encounter (Signed)
Called and left voicemail message on patient's number letting her know Disability Placard is up front and ready to be picked up. Was not sure of Amanda's number to call. AM *01/03/21

## 2021-01-05 IMAGING — MR MR HEAD W/O CM
10 series · 48 of 48 positions shown · non-contrast
Comparison: Carotid artery duplex 09/25/2014

CLINICAL DATA: Late onset Alzheimer's disease with behavioral
disturbance, Alzheimer's disease. Increasing forgetfulness, abnormal
MMSE, evaluate for structural abnormalities related to dementia;
dementia, Alzheimer's suspected.

EXAM:
MRI HEAD WITHOUT CONTRAST
TECHNIQUE: Multiplanar, multiecho pulse sequences of the brain and surrounding
structures were obtained without intravenous contrast.

[Series 4: DWI · axial · 3.0mm · 1.20mm/px · z∈[-58,+85]mm · 5 of 47 slices shown (1 of 4)]
[im 1/47]
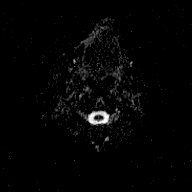
[im 12/47]
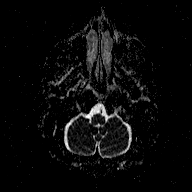
[im 24/47]
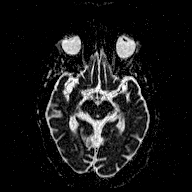
[im 35/47]
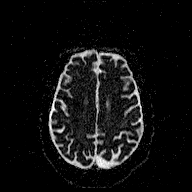
[im 47/47]
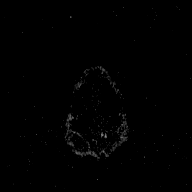

[Series 6: DWI · coronal · 3.0mm · 1.15mm/px · 4 of 43 slices shown (2 of 4)]
[im 1/43]
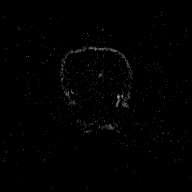
[im 15/43]
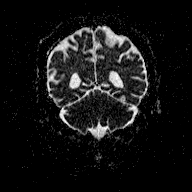
[im 29/43]
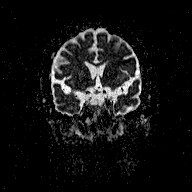
[im 43/43]
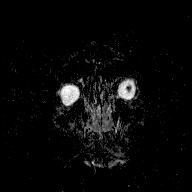

[Series 7: T1 · sagittal · 5.0mm · 0.45mm/px · 2 of 21 slices shown (1 of 2)]
[im 1/21]
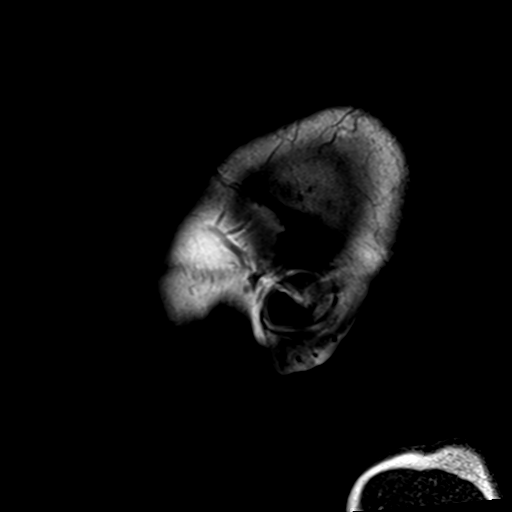
[im 21/21]
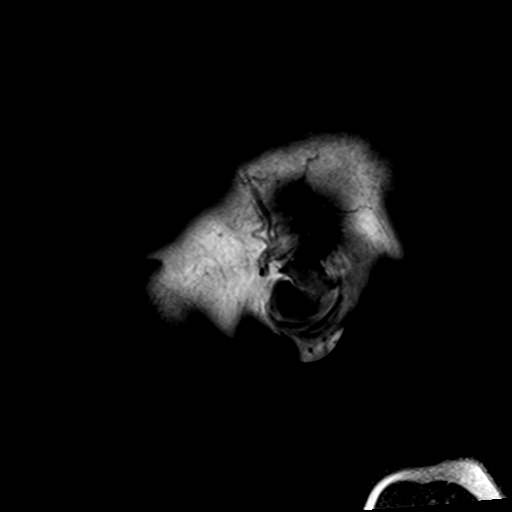

[Series 8: T2 · axial · 5.0mm · 0.72mm/px · z∈[-52,+80]mm · 2 of 20 slices shown (1 of 3)]
[im 1/20]
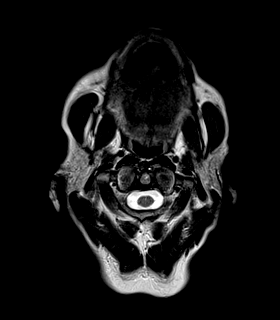
[im 20/20]
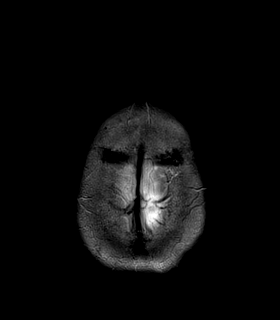

[Series 9: FLAIR · axial · 3.0mm · 0.45mm/px · z∈[-67,+95]mm · 5 of 55 slices shown]
[im 1/55]
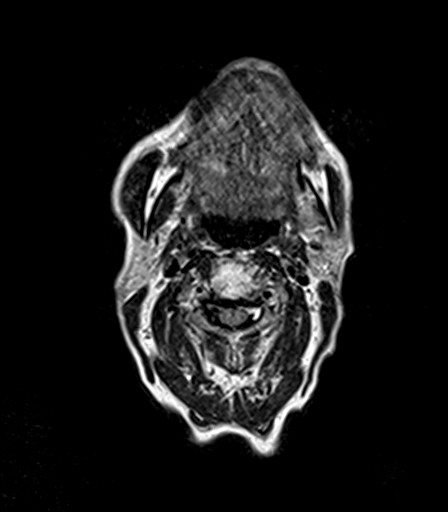
[im 14/55]
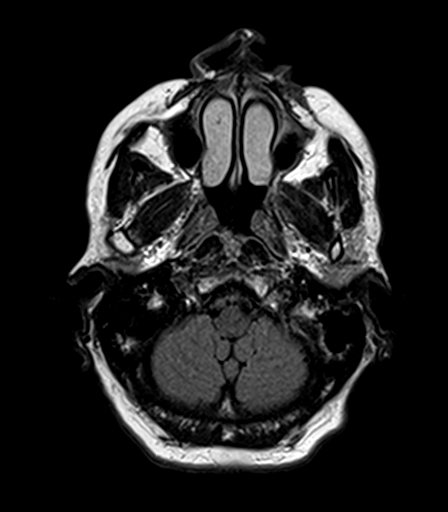
[im 28/55]
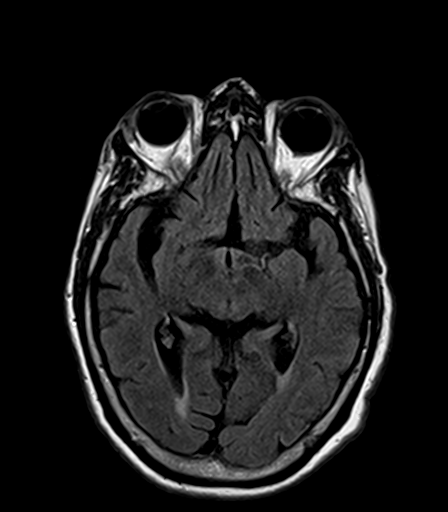
[im 41/55]
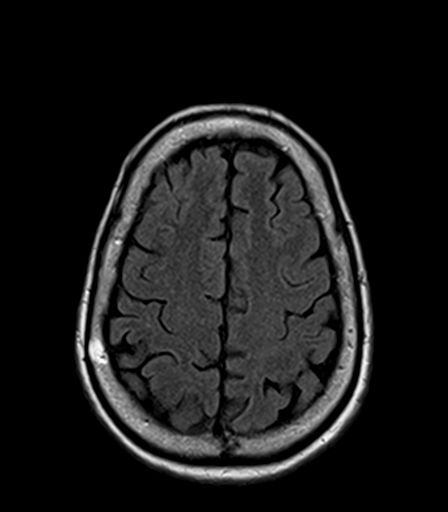
[im 55/55]
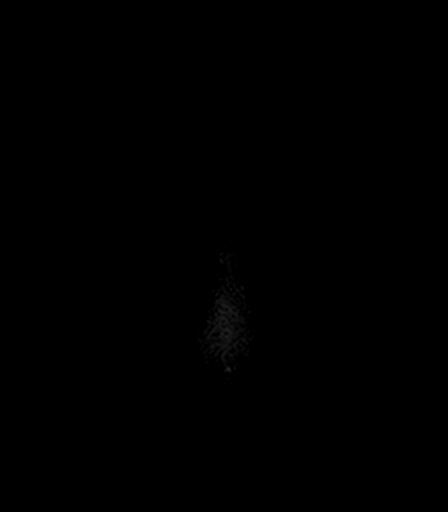

[Series 10: T2 · axial · 5.0mm · 0.72mm/px · z∈[-52,+80]mm · 2 of 20 slices shown (2 of 3)]
[im 1/20]
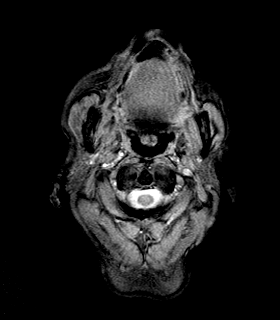
[im 20/20]
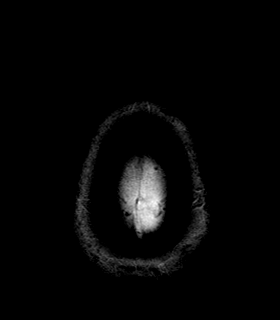

[Series 11: T1 · axial · 1.0mm · 1.00mm/px · z∈[-64,+94]mm · 15 of 160 slices shown (2 of 2)]
[im 1/160]
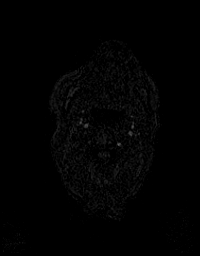
[im 12/160]
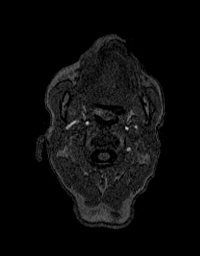
[im 23/160]
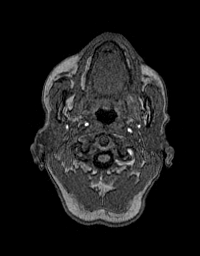
[im 35/160]
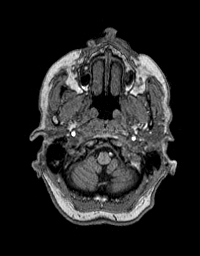
[im 46/160]
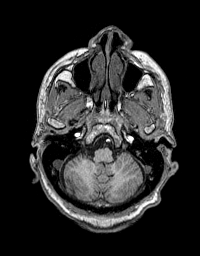
[im 57/160]
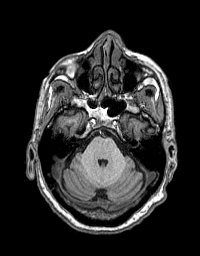
[im 69/160]
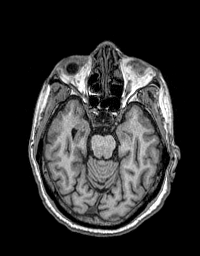
[im 80/160]
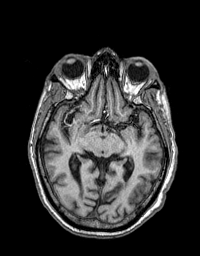
[im 91/160]
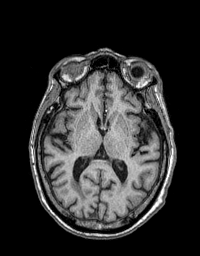
[im 103/160]
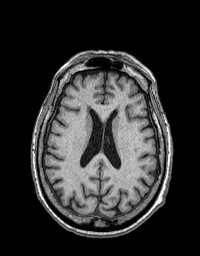
[im 114/160]
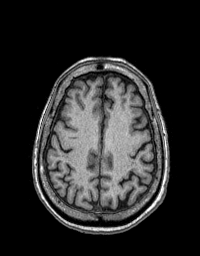
[im 125/160]
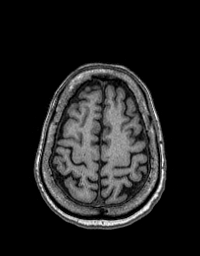
[im 137/160]
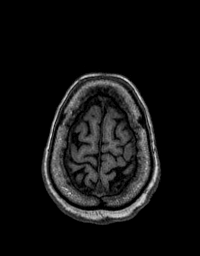
[im 148/160]
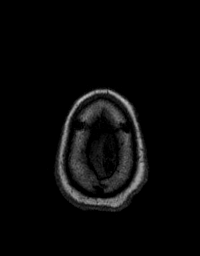
[im 160/160]
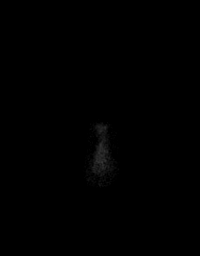

[Series 12: T2 · coronal · 3.0mm · 0.43mm/px · 4 of 43 slices shown (3 of 3)]
[im 1/43]
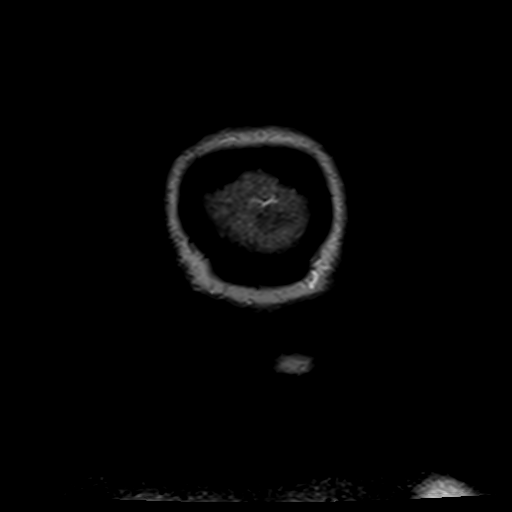
[im 15/43]
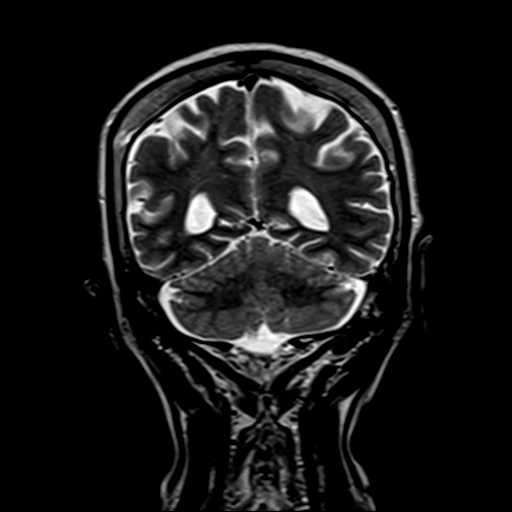
[im 29/43]
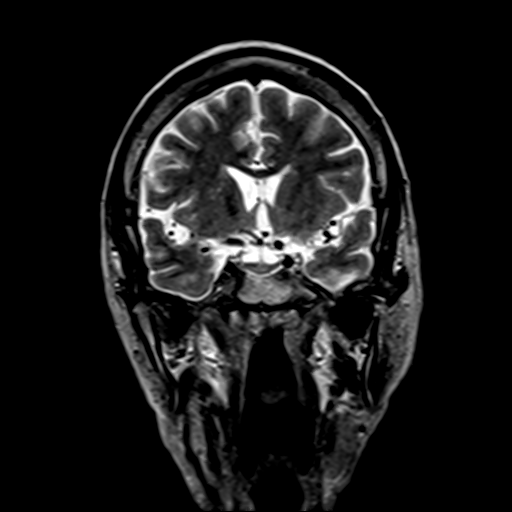
[im 43/43]
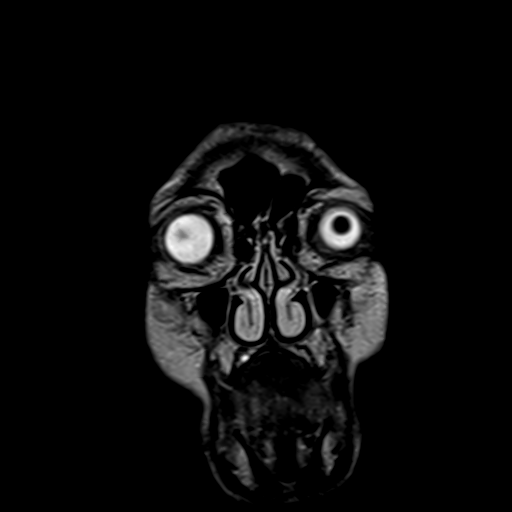

[Series 100: DWI · axial · 3.0mm · 1.20mm/px · z∈[-58,+85]mm · 5 of 49 slices shown (3 of 4)]
[im 1/49]
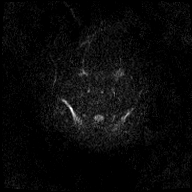
[im 13/49]
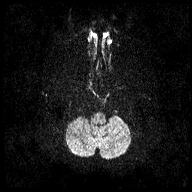
[im 25/49]
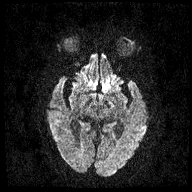
[im 37/49]
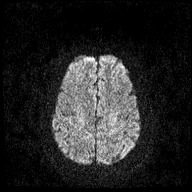
[im 49/49]
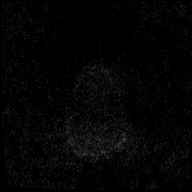

[Series 101: DWI · coronal · 3.0mm · 1.15mm/px · 4 of 43 slices shown (4 of 4)]
[im 1/43]
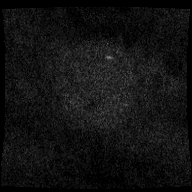
[im 15/43]
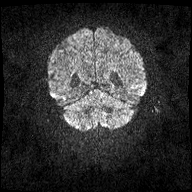
[im 29/43]
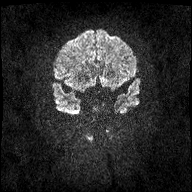
[im 43/43]
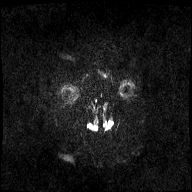

[48 of 48 positions shown; findings below may reference images not displayed]

FINDINGS: Brain:

There is no evidence of acute infarct.

No evidence of intracranial mass.

No midline shift or extra-axial fluid collection.

No chronic intracranial blood products.

No significant white matter disease for age.

Bilateral mesial temporal lobe atrophy is disproportionate to
otherwise mild generalized cerebral atrophy.

Vascular: Flow voids maintained within the proximal large arterial
vessels.

Skull and upper cervical spine: 11 mm T1 hyperintense lesion within
the right parietal calvarium, nonspecific but possibly reflecting a
hemangioma. Incompletely assessed upper cervical spondylosis with
multilevel posterior disc osteophytes.

Sinuses/Orbits: Visualized orbits demonstrate no acute abnormality.
Mild scattered paranasal sinus mucosal thickening greatest within
left ethmoid air cells. No significant mastoid effusion
IMPRESSION: No evidence of acute intracranial abnormality.

Bilateral mesial temporal lobe atrophy is disproportionate to
otherwise mild generalized cerebral atrophy.

## 2021-01-06 ENCOUNTER — Other Ambulatory Visit: Payer: Self-pay | Admitting: Family Medicine

## 2021-01-06 DIAGNOSIS — N1831 Chronic kidney disease, stage 3a: Secondary | ICD-10-CM | POA: Diagnosis not present

## 2021-01-06 DIAGNOSIS — I701 Atherosclerosis of renal artery: Secondary | ICD-10-CM | POA: Diagnosis not present

## 2021-01-06 DIAGNOSIS — E871 Hypo-osmolality and hyponatremia: Secondary | ICD-10-CM | POA: Diagnosis not present

## 2021-01-06 MED ORDER — AMLODIPINE BESYLATE 10 MG PO TABS: 10.0000 mg | ORAL_TABLET | Freq: Every day | ORAL | 1 refills | Status: DC

## 2021-01-30 ENCOUNTER — Telehealth: Payer: Medicare HMO

## 2021-01-31 ENCOUNTER — Telehealth: Payer: Self-pay | Admitting: Pharmacist

## 2021-01-31 NOTE — Chronic Care Management (AMB) (Signed)
  Care Management   Note  01/31/2021 Name: [name] MRN: [id number] DOB: April 15, 1941  Cassidy Jennings is a 80 y.o. year old female who is a primary care patient of [name], DO and is actively engaged with the care management team. I reached out to Barrett Shell by phone today to assist with re-scheduling a follow up visit with the Pharmacist  Follow up plan: Unsuccessful telephone outreach attempt made. A HIPAA compliant phone message was left for the patient providing contact information and requesting a return call.   Julian Hy, [place] Management  Direct Dial: [phone]

## 2021-01-31 NOTE — Telephone Encounter (Signed)
Unsuccessful attempt x2 to contact Cassidy Jennings (and family support) for follow-up phone call for CCM services with pharmacist.  Will route to schedule team for reschedule.

## 2021-02-03 NOTE — Chronic Care Management (AMB) (Signed)
  Care Management   Note  02/03/2021 Name: Cassidy Jennings MRN: [id number] DOB: [birth year]  Cassidy Jennings is a 80 y.o. year old female who is a primary care patient of [name], DO and is actively engaged with the care management team. I reached out to Barrett Shell by phone today to assist with re-scheduling a follow up visit with the Pharmacist  Follow up plan: Telephone appointment with care management team member scheduled for: 02/07/2021  Julian Hy, [place], [place] Management  Direct Dial: [phone]

## 2021-02-07 ENCOUNTER — Ambulatory Visit (INDEPENDENT_AMBULATORY_CARE_PROVIDER_SITE_OTHER): Payer: Medicare HMO | Admitting: Pharmacist

## 2021-02-07 ENCOUNTER — Other Ambulatory Visit: Payer: Self-pay

## 2021-02-07 DIAGNOSIS — E782 Mixed hyperlipidemia: Secondary | ICD-10-CM

## 2021-02-07 DIAGNOSIS — K219 Gastro-esophageal reflux disease without esophagitis: Secondary | ICD-10-CM

## 2021-02-07 DIAGNOSIS — I10 Essential (primary) hypertension: Secondary | ICD-10-CM

## 2021-02-07 NOTE — Patient Instructions (Signed)
Visit Information  PATIENT GOALS:  Goals Addressed             This Visit's Progress    Medication Management       Patient Goals/Self-Care Activities Over the next 30 days, patient will:  focus on medication adherence by setting up appt with PCP for annual well check and check blood pressure weekly, document, and provide at future appointments  Follow Up Plan: Telephone follow up appointment with care management team member scheduled for:  1 month          The patient verbalized understanding of instructions, educational materials, and care plan provided today and agreed to receive a mailed copy of patient instructions, educational materials, and care plan.   Telephone follow up appointment with care management team member scheduled for: 3 weeks  Darius Bump

## 2021-02-07 NOTE — Progress Notes (Signed)
Chronic Care Management Pharmacy Note  02/07/2021 Name:  Cassidy Jennings MRN:  [id number] DOB:  09/02/1940  Summary: addressed HTN, HLD, medication adherence. During our last visit, decided to streamline medications - stopped atorvastatin and pantoprazole. No rebound acid secretion or issues, doing well. Patient is still not taking clopidogrel, was questioning if still need DAPT versus aspirin monotherapy.  Nephrology added losartan since my last visit with her, agree with ARB for additional BP control.  Recommendations/Changes made from today's visit:  - Recommend aspirin monotherapy, pending collaboration with PCP.   Plan: f/u with pharmacist in 1 month to review BP and finalize antiplatelet plan.  Subjective: Cassidy Jennings is an 80 y.o. year old female who is a primary patient of [name], DO.  The CCM team was consulted for assistance with disease management and care coordination needs.    Engaged with patient by telephone for initial visit in response to provider referral for pharmacy case management and/or care coordination services.   Consent to Services:  The patient was given information about Chronic Care Management services, agreed to services, and gave verbal consent prior to initiation of services.  Please see initial visit note for detailed documentation.   Patient Care Team: [name], DO as PCP - General (Family Medicine) Darius Bump, [facility] as Pharmacist (Pharmacist)  Recent office visits:  09/18/20-Cody Zigmund Daniel, DO (PCP) Seen due to a fall. X-rays ordered.  Recommend icing and tylenol arthritis for pain.   Recent consult visits:  08/16/20-Karl A. Pleasant (Gastroenterology) Seen for Anemia. Labs ordered. Follow up in 1 year. 07/26/20-Shawn H. Raul Del (Vascular Specialist) Notes not available. 07/18/20-Claude Mellody Dance (Vascular Surgery) Notes not available. 06/24/20-John Lenon Curt (Neurology) Patient evaluation. Follow up in 6 months.    Hospital visits:  None in previous 6 months  Objective:  Lab Results  Component Value Date   CREATININE 1.42 (H) 09/18/2020   CREATININE 1.18 (H) 11/10/2019   CREATININE 1.73 (H) 04/12/2018    Lab Results  Component Value Date   HGBA1C 6.4 (A) 04/12/2018       Component Value Date/Time   CHOL 165 04/12/2018 1100   TRIG 167 (H) 04/12/2018 1100   HDL 39 (L) 04/12/2018 1100   CHOLHDL 4.2 04/12/2018 1100   VLDL 29 09/02/2015 1025   LDLCALC 99 04/12/2018 1100   LDLDIRECT 127 (H) 11/17/2007 1538    Hepatic Function Latest Ref Rng & Units 09/18/2020 11/10/2019 05/16/2019  Total Protein 6.1 - 8.1 g/dL 7.1 5.8(L) 7.0  Albumin 3.6 - 5.1 g/dL - - -  AST 10 - 35 U/L 16 15 -  ALT 6 - 29 U/L 14 18 -  Alk Phosphatase 33 - 130 U/L - - -  Total Bilirubin 0.2 - 1.2 mg/dL 0.7 0.4 -    Lab Results  Component Value Date/Time   TSH 2.38 09/18/2020 12:36 PM   TSH 1.97 05/16/2019 03:47 PM    CBC Latest Ref Rng & Units 09/18/2020 11/10/2019 05/16/2019  WBC 3.8 - 10.8 Thousand/uL 4.7 6.1 6.8  Hemoglobin 11.7 - 15.5 g/dL 12.6 8.7(L) 11.7  Hematocrit 35.0 - 45.0 % 37.6 25.2(L) 34.1(L)  Platelets 140 - 400 Thousand/uL 210 290 226    Lab Results  Component Value Date/Time   VD25OH 62 05/16/2019 03:47 PM   VD25OH 60 08/05/2016 10:22 AM     Social History   Tobacco Use  Smoking Status Every Day   Packs/day: 0.25   Years: 63.00   Pack years: 15.75   Types:  Cigarettes  Smokeless Tobacco Never   BP Readings from Last 3 Encounters:  09/18/20 (!) 148/85  12/14/19 139/68  11/10/19 (!) 147/61   Pulse Readings from Last 3 Encounters:  09/18/20 84  12/14/19 97  11/10/19 85   Wt Readings from Last 3 Encounters:  09/18/20 119 lb 1.9 oz (54 kg)  12/14/19 139 lb (63 kg)  11/10/19 129 lb (58.5 kg)    Assessment: Review of patient past medical history, allergies, medications, health status, including review of consultants reports, laboratory and other test data, was performed as part of  comprehensive evaluation and provision of chronic care management services.   SDOH:  (Social Determinants of Health) assessments and interventions performed:    CCM Care Plan  Allergies  Allergen Reactions   Simvastatin Nausea And Vomiting    gerd gerd    Medications Reviewed Today     Reviewed by Darius Bump, [facility] (Pharmacist) on 01/01/21 at 0925  Med List Status: <None>   Medication Order Taking? Sig Documenting Provider Last Dose Status Informant  amLODipine (NORVASC) 5 MG tablet [id number] Yes Take 1 tablet (5 mg total) by mouth daily. [name], DO Taking Active   aspirin 81 MG tablet [id number] Yes Take 81 mg by mouth daily. [provider] Taking Active   atorvastatin (LIPITOR) 40 MG tablet [id number] No Take 1 tablet (40 mg total) by mouth daily.  Patient not taking: Reported on 01/01/2021   [name], DO Not Taking Active   Cholecalciferol (VITAMIN D3) 50 MCG (2000 UT) capsule [id number] Yes Take by mouth.  [provider] Taking Active   clopidogrel (PLAVIX) 75 MG tablet [id number] No Take 1 tablet by mouth daily.  Patient not taking: Reported on 01/01/2021   [provider] Not Taking Active   donepezil (ARICEPT) 10 MG tablet [id number] Yes Take 1 tablet (10 mg total) by mouth at bedtime as needed. [name], DO Taking Active   latanoprost (XALATAN) 0.005 % ophthalmic solution [id number] Yes 1 drop at bedtime. [provider] Taking Active   losartan (COZAAR) 100 MG tablet [id number] No TAKE 1 TABLET(100 MG) BY MOUTH DAILY  Patient not taking: Reported on 01/01/2021   [name], DO Not Taking Active   MULTIPLE VITAMIN PO [id number] Yes Take by mouth. [provider] Taking Active   pantoprazole (PROTONIX) 40 MG tablet [id number] Yes TAKE 1 TABLET(40 MG) BY MOUTH DAILY [name], DO Taking Active   traZODone (DESYREL) 50 MG tablet [id number] No Take 0.5-1 tablets (25-50 mg total) by mouth at bedtime as needed for  sleep.  Patient not taking: Reported on 01/01/2021   [name], DO Not Taking Active             Patient Active Problem List   Diagnosis Date Noted   Acute pain of right knee 09/22/2020   Venous insufficiency 12/14/2019   Blood loss anemia 11/10/2019   Mallory-Weiss tear 11/06/2019   Acute hyponatremia 11/04/2019   Renal artery stenosis ([place]) 12/29/2016   Osteopenia 09/23/2016   Post-menopausal 08/05/2016   PAD (peripheral artery disease) ([place]) 01/11/2015   CKD (chronic kidney disease) stage 3, GFR 30-59 ml/min ([place]) 01/01/2015   Weight loss 12/31/2014   Carotid artery disease ([place]) 08/01/2014   Tobacco abuse 07/13/2014   Prediabetes 09/12/2012   Cataract 05/10/2012   Glaucoma 05/10/2012   Hypertension    Hyperlipidemia    Alzheimer's dementia ([place]) 08/09/2009   PAP SMEAR, ABNORMAL 03/21/2009   GERD 03/19/2009   Hearing loss 11/07/2007  Immunization History  Administered Date(s) Administered   Influenza Split 03/22/2011, 03/31/2012   Influenza Whole 03/19/2009, 04/17/2010   Influenza, High Dose Seasonal PF 03/30/2019   Influenza,inj,Quad PF,6+ Mos 01/31/2015, 01/13/2017   Influenza-Unspecified 05/12/2015, 03/11/2018   PPD Test 02/26/2020   Pneumococcal Conjugate-13 01/31/2015   Pneumococcal Polysaccharide-23 04/21/2011   Tdap 04/21/2011   Zoster, Live 04/10/2012    Conditions to be addressed/monitored: HTN, HLD, and GERD  There are no care plans that you recently modified to display for this patient.   Medication Assistance: None required.  Patient affirms current coverage meets needs.  Patient's preferred pharmacy is:  [id number] - [place], [place] - [address] [place] [address] Phone: [phone] Fax: [phone]  Uses pill box? Yes - daughter in law manages, and administers doses. Lives with son & daughter in law. Pt endorses 100% compliance  Follow Up:  Patient  agrees to Care Plan and Follow-up.  Plan: Telephone follow up appointment with care management team member scheduled for:  1 month  Darius Bump

## 2021-02-22 DIAGNOSIS — H43812 Vitreous degeneration, left eye: Secondary | ICD-10-CM | POA: Diagnosis not present

## 2021-02-26 DIAGNOSIS — N1831 Chronic kidney disease, stage 3a: Secondary | ICD-10-CM | POA: Diagnosis not present

## 2021-02-27 DIAGNOSIS — H401133 Primary open-angle glaucoma, bilateral, severe stage: Secondary | ICD-10-CM | POA: Diagnosis not present

## 2021-02-27 DIAGNOSIS — H4312 Vitreous hemorrhage, left eye: Secondary | ICD-10-CM | POA: Diagnosis not present

## 2021-02-27 DIAGNOSIS — H43812 Vitreous degeneration, left eye: Secondary | ICD-10-CM | POA: Diagnosis not present

## 2021-02-27 DIAGNOSIS — H2513 Age-related nuclear cataract, bilateral: Secondary | ICD-10-CM | POA: Diagnosis not present

## 2021-02-27 DIAGNOSIS — Z79899 Other long term (current) drug therapy: Secondary | ICD-10-CM | POA: Diagnosis not present

## 2021-03-06 ENCOUNTER — Ambulatory Visit (INDEPENDENT_AMBULATORY_CARE_PROVIDER_SITE_OTHER): Payer: Medicare HMO | Admitting: Pharmacist

## 2021-03-06 ENCOUNTER — Other Ambulatory Visit: Payer: Self-pay

## 2021-03-06 DIAGNOSIS — K219 Gastro-esophageal reflux disease without esophagitis: Secondary | ICD-10-CM

## 2021-03-06 DIAGNOSIS — E782 Mixed hyperlipidemia: Secondary | ICD-10-CM

## 2021-03-06 DIAGNOSIS — I10 Essential (primary) hypertension: Secondary | ICD-10-CM

## 2021-03-06 NOTE — Progress Notes (Signed)
Chronic Care Management Pharmacy Note  03/07/2021 Name:  Cassidy Jennings MRN:  [id number] DOB:  [birth year]  Summary: addressed HTN, HLD, medication adherence. BP still up and down despite losartan added by nephrology. Caregiver Estill Bamberg will still take BP and continue to follow up.   Recommendations/Changes made from today's visit:  No changes at this time  Plan: f/u with pharmacist in 6-7 months  Subjective: Cassidy Jennings is an 80 y.o. year old female who is a primary patient of [name], DO.  The CCM team was consulted for assistance with disease management and care coordination needs.    Engaged with patient by telephone for follow up visit in response to provider referral for pharmacy case management and/or care coordination services.   Consent to Services:  The patient was given information about Chronic Care Management services, agreed to services, and gave verbal consent prior to initiation of services.  Please see initial visit note for detailed documentation.   Patient Care Team: [name], DO as PCP - General (Family Medicine) Darius Bump, [facility] as Pharmacist (Pharmacist)  Recent office visits:  09/18/20-Cody Zigmund Daniel, DO (PCP) Seen due to a fall. X-rays ordered.  Recommend icing and tylenol arthritis for pain.   Recent consult visits:  08/16/20-Karl A. Pleasant (Gastroenterology) Seen for Anemia. Labs ordered. Follow up in 1 year. 07/26/20-Shawn H. Raul Del (Vascular Specialist) Notes not available. 07/18/20-Claude Mellody Dance (Vascular Surgery) Notes not available. 06/24/20-John Lenon Curt (Neurology) Patient evaluation. Follow up in 6 months.   Hospital visits:  None in previous 6 months  Objective:  Lab Results  Component Value Date   CREATININE 1.42 (H) 09/18/2020   CREATININE 1.18 (H) 11/10/2019   CREATININE 1.73 (H) 04/12/2018    Lab Results  Component Value Date   HGBA1C 6.4 (A) 04/12/2018       Component Value Date/Time   CHOL  165 04/12/2018 1100   TRIG 167 (H) 04/12/2018 1100   HDL 39 (L) 04/12/2018 1100   CHOLHDL 4.2 04/12/2018 1100   VLDL 29 09/02/2015 1025   LDLCALC 99 04/12/2018 1100   LDLDIRECT 127 (H) 11/17/2007 1538    Hepatic Function Latest Ref Rng & Units 09/18/2020 11/10/2019 05/16/2019  Total Protein 6.1 - 8.1 g/dL 7.1 5.8(L) 7.0  Albumin 3.6 - 5.1 g/dL - - -  AST 10 - 35 U/L 16 15 -  ALT 6 - 29 U/L 14 18 -  Alk Phosphatase 33 - 130 U/L - - -  Total Bilirubin 0.2 - 1.2 mg/dL 0.7 0.4 -    Lab Results  Component Value Date/Time   TSH 2.38 09/18/2020 12:36 PM   TSH 1.97 05/16/2019 03:47 PM    CBC Latest Ref Rng & Units 09/18/2020 11/10/2019 05/16/2019  WBC 3.8 - 10.8 Thousand/uL 4.7 6.1 6.8  Hemoglobin 11.7 - 15.5 g/dL 12.6 8.7(L) 11.7  Hematocrit 35.0 - 45.0 % 37.6 25.2(L) 34.1(L)  Platelets 140 - 400 Thousand/uL 210 290 226    Lab Results  Component Value Date/Time   VD25OH 62 05/16/2019 03:47 PM   VD25OH 60 08/05/2016 10:22 AM     Social History   Tobacco Use  Smoking Status Every Day   Packs/day: 0.25   Years: 63.00   Pack years: 15.75   Types: Cigarettes  Smokeless Tobacco Never   BP Readings from Last 3 Encounters:  09/18/20 (!) 148/85  12/14/19 139/68  11/10/19 (!) 147/61   Pulse Readings from Last 3 Encounters:  09/18/20 84  12/14/19 97  11/10/19 85  Wt Readings from Last 3 Encounters:  09/18/20 119 lb 1.9 oz (54 kg)  12/14/19 139 lb (63 kg)  11/10/19 129 lb (58.5 kg)    Assessment: Review of patient past medical history, allergies, medications, health status, including review of consultants reports, laboratory and other test data, was performed as part of comprehensive evaluation and provision of chronic care management services.   SDOH:  (Social Determinants of Health) assessments and interventions performed:    CCM Care Plan  Allergies  Allergen Reactions   Simvastatin Nausea And Vomiting    gerd gerd    Medications Reviewed Today     Reviewed by  Darius Bump, [facility] (Pharmacist) on 03/06/21 at 1324  Med List Status: <None>   Medication Order Taking? Sig Documenting Provider Last Dose Status Informant  amLODipine (NORVASC) 10 MG tablet [id number] Yes Take 1 tablet (10 mg total) by mouth daily. [name], DO Taking Active   aspirin 81 MG tablet [id number] Yes Take 81 mg by mouth daily. [provider] Taking Active   Cholecalciferol (VITAMIN D3) 50 MCG (2000 UT) capsule [id number] Yes Take by mouth.  [provider] Taking Active   donepezil (ARICEPT) 10 MG tablet [id number] Yes Take 1 tablet (10 mg total) by mouth at bedtime as needed. [name], DO Taking Active   latanoprost (XALATAN) 0.005 % ophthalmic solution [id number] Yes Place 1 drop into both eyes at bedtime. [provider] Taking Active   losartan (COZAAR) 100 MG tablet [id number] Yes TAKE 1 TABLET(100 MG) BY MOUTH DAILY [name], DO Taking Active   MULTIPLE VITAMIN PO [id number] Yes Take by mouth. [provider] Taking Active             Patient Active Problem List   Diagnosis Date Noted   Acute pain of right knee 09/22/2020   Venous insufficiency 12/14/2019   Blood loss anemia 11/10/2019   Mallory-Weiss tear 11/06/2019   Acute hyponatremia 11/04/2019   Renal artery stenosis ([place]) 12/29/2016   Osteopenia 09/23/2016   Post-menopausal 08/05/2016   PAD (peripheral artery disease) ([place]) 01/11/2015   CKD (chronic kidney disease) stage 3, GFR 30-59 ml/min ([place]) 01/01/2015   Weight loss 12/31/2014   Carotid artery disease ([place]) 08/01/2014   Tobacco abuse 07/13/2014   Prediabetes 09/12/2012   Cataract 05/10/2012   Glaucoma 05/10/2012   Hypertension    Hyperlipidemia    Alzheimer's dementia ([place]) 08/09/2009   PAP SMEAR, ABNORMAL 03/21/2009   GERD 03/19/2009   Hearing loss 11/07/2007    Immunization History  Administered Date(s) Administered   Influenza Split 03/22/2011, 03/31/2012   Influenza Whole 03/19/2009,  04/17/2010   Influenza, High Dose Seasonal PF 03/30/2019   Influenza,inj,Quad PF,6+ Mos 01/31/2015, 01/13/2017   Influenza-Unspecified 05/12/2015, 03/11/2018   PPD Test 02/26/2020   Pneumococcal Conjugate-13 01/31/2015   Pneumococcal Polysaccharide-23 04/21/2011   Tdap 04/21/2011   Zoster, Live 04/10/2012    Conditions to be addressed/monitored: HTN, HLD, and GERD  Care Plan : Medication Management  Updates made by Darius Bump, [place] since 03/07/2021 12:00 AM     Problem: HTN, HLD, GERD      Long-Range Goal: Disease Progression Prevention   Start Date: 01/01/2021  Recent Progress: On track  Priority: High  Note:   Current Barriers:  Unable to self administer medications as prescribed, medications are organized, maintained, and provided to patient on daily basis by Einar Grad, daughter in law.  Pharmacist Clinical Goal(s):  Over the next 180 days, patient will adhere to plan  to optimize therapeutic regimen for chronic conditions as evidenced by report of adherence to recommended medication management changes through collaboration with PharmD and provider.   Interventions: 1:1 collaboration with [name], DO regarding development and update of comprehensive plan of care as evidenced by provider attestation and co-signature Inter-disciplinary care team collaboration (see longitudinal plan of care) Comprehensive medication review performed; medication list updated in electronic medical record  Hypertension:  Uncontrolled; current treatment: amlodipine 11m, losartan 1052mdaily,   Current home readings: 147/99 at last check, plans to call nephrologist to update w/ BP numbers from logbook  Denies hypotensive/hypertensive symptoms  Counseled on proper technique for taking BP at home Recommend continue current regimen  Hyperlipidemia:  Controlled; current treatment: not filling/taking atorvastatin 4032mo therefore we discontinued it; LDL 99 during 2019  labs  Recommend continue current regimen of lifestyle modifications only GERD:   Controlled; current treatment: recently d/ced pantoprazole 16m23mily without any rebound acid secretion or symptomatic issues.  Recommend continue current regimen of lifestyle modifications only  Patient Goals/Self-Care Activities Over the next 180 days, patient will:  focus on medication adherence by setting up appt with PCP for annual well check and check blood pressure weekly, document, and provide at future appointments  Follow Up Plan: Telephone follow up appointment with care management team member scheduled for:  6-7 months      Medication Assistance: None required.  Patient affirms current coverage meets needs.  Patient's preferred pharmacy is:  [id number]: [phone]: [phone] pill box? Yes - daughter in law manages, and administers doses. Lives with son & daughter in law. Pt endorses 100% compliance  Follow Up:  Patient agrees to Care Plan and Follow-up.  Plan: Telephone follow up appointment with care management team member scheduled for:  6-7 month s  KeesLarinda ButteryarmD Clinical Pharmacist [phone]

## 2021-03-07 NOTE — Patient Instructions (Signed)
Visit Information  PATIENT GOALS:  Goals Addressed             This Visit's Progress    Medication Management       Patient Goals/Self-Care Activities Over the next 180 days, patient will:  focus on medication adherence by setting up appt with PCP for annual well check and check blood pressure weekly, document, and provide at future appointments  Follow Up Plan: Telephone follow up appointment with care management team member scheduled for:  6-7 months        The patient verbalized understanding of instructions, educational materials, and care plan provided today and agreed to receive a mailed copy of patient instructions, educational materials, and care plan.   Telephone follow up appointment with care management team member scheduled for: 6-7 months Cassidy Jennings

## 2021-03-10 DIAGNOSIS — E782 Mixed hyperlipidemia: Secondary | ICD-10-CM

## 2021-03-10 DIAGNOSIS — I10 Essential (primary) hypertension: Secondary | ICD-10-CM | POA: Diagnosis not present

## 2021-03-18 DIAGNOSIS — H2513 Age-related nuclear cataract, bilateral: Secondary | ICD-10-CM | POA: Diagnosis not present

## 2021-03-18 DIAGNOSIS — H35012 Changes in retinal vascular appearance, left eye: Secondary | ICD-10-CM | POA: Diagnosis not present

## 2021-03-18 DIAGNOSIS — H401133 Primary open-angle glaucoma, bilateral, severe stage: Secondary | ICD-10-CM | POA: Diagnosis not present

## 2021-03-18 DIAGNOSIS — R69 Illness, unspecified: Secondary | ICD-10-CM | POA: Diagnosis not present

## 2021-03-18 DIAGNOSIS — H4312 Vitreous hemorrhage, left eye: Secondary | ICD-10-CM | POA: Diagnosis not present

## 2021-03-18 DIAGNOSIS — I10 Essential (primary) hypertension: Secondary | ICD-10-CM | POA: Diagnosis not present

## 2021-03-18 DIAGNOSIS — H43812 Vitreous degeneration, left eye: Secondary | ICD-10-CM | POA: Diagnosis not present

## 2021-04-23 DIAGNOSIS — H35012 Changes in retinal vascular appearance, left eye: Secondary | ICD-10-CM | POA: Diagnosis not present

## 2021-04-23 DIAGNOSIS — Z79899 Other long term (current) drug therapy: Secondary | ICD-10-CM | POA: Diagnosis not present

## 2021-04-23 DIAGNOSIS — H2513 Age-related nuclear cataract, bilateral: Secondary | ICD-10-CM | POA: Diagnosis not present

## 2021-04-23 DIAGNOSIS — I10 Essential (primary) hypertension: Secondary | ICD-10-CM | POA: Diagnosis not present

## 2021-04-23 DIAGNOSIS — H3589 Other specified retinal disorders: Secondary | ICD-10-CM | POA: Diagnosis not present

## 2021-04-23 DIAGNOSIS — H401133 Primary open-angle glaucoma, bilateral, severe stage: Secondary | ICD-10-CM | POA: Diagnosis not present

## 2021-04-23 DIAGNOSIS — R69 Illness, unspecified: Secondary | ICD-10-CM | POA: Diagnosis not present

## 2021-04-23 DIAGNOSIS — H4312 Vitreous hemorrhage, left eye: Secondary | ICD-10-CM | POA: Diagnosis not present

## 2021-06-15 ENCOUNTER — Other Ambulatory Visit: Payer: Self-pay | Admitting: Family Medicine

## 2021-06-15 DIAGNOSIS — F0280 Dementia in other diseases classified elsewhere without behavioral disturbance: Secondary | ICD-10-CM

## 2021-06-17 DIAGNOSIS — R69 Illness, unspecified: Secondary | ICD-10-CM | POA: Diagnosis not present

## 2021-06-17 DIAGNOSIS — H4312 Vitreous hemorrhage, left eye: Secondary | ICD-10-CM | POA: Diagnosis not present

## 2021-06-17 DIAGNOSIS — H35012 Changes in retinal vascular appearance, left eye: Secondary | ICD-10-CM | POA: Diagnosis not present

## 2021-06-17 DIAGNOSIS — H401133 Primary open-angle glaucoma, bilateral, severe stage: Secondary | ICD-10-CM | POA: Diagnosis not present

## 2021-06-17 DIAGNOSIS — H2513 Age-related nuclear cataract, bilateral: Secondary | ICD-10-CM | POA: Diagnosis not present

## 2021-06-17 DIAGNOSIS — I10 Essential (primary) hypertension: Secondary | ICD-10-CM | POA: Diagnosis not present

## 2021-06-26 DIAGNOSIS — N1831 Chronic kidney disease, stage 3a: Secondary | ICD-10-CM | POA: Diagnosis not present

## 2021-06-26 DIAGNOSIS — R69 Illness, unspecified: Secondary | ICD-10-CM | POA: Diagnosis not present

## 2021-06-26 DIAGNOSIS — I701 Atherosclerosis of renal artery: Secondary | ICD-10-CM | POA: Diagnosis not present

## 2021-06-26 DIAGNOSIS — R4189 Other symptoms and signs involving cognitive functions and awareness: Secondary | ICD-10-CM | POA: Diagnosis not present

## 2021-07-09 ENCOUNTER — Telehealth: Payer: Self-pay | Admitting: *Deleted

## 2021-07-09 NOTE — Chronic Care Management (AMB) (Signed)
?  Care Management  ? ?Note ? ?07/09/2021 ?Name: Cassidy Jennings MRN: [id number] DOB: 1941-04-18 ? ?Cassidy Jennings is a 81 y.o. year old female who is a primary care patient of [name], DO and is actively engaged with the care management team. I reached out to Barrett Shell by phone today to assist with re-scheduling a follow up visit with the Pharmacist ? ?Follow up plan: ?Unsuccessful telephone outreach attempt made. A HIPAA compliant phone message was left for the patient providing contact information and requesting a return call.  ? ?Harm Jou, CCMA ?Care Guide, Embedded Care Coordination ?[place]  Care Management  ?Direct Dial: [phone] ? ? ?

## 2021-07-11 NOTE — Chronic Care Management (AMB) (Signed)
?  Care Management  ? ?Note ? ?07/11/2021 ?Name: Cassidy Jennings MRN: [id number] DOB: 06-29-1940 ? ?Cassidy Jennings is a 81 y.o. year old female who is a primary care patient of [name], DO and is actively engaged with the care management team. I reached out to Barrett Shell by phone today to assist with re-scheduling a follow up visit with the Pharmacist ? ?Follow up plan: ?Telephone appointment with care management team member scheduled for: 10/31/2021 ? ?[name], CCMA ?Care Guide, Embedded Care Coordination ?[place]  Care Management  ?Direct Dial: [phone] ? ? ?

## 2021-07-21 ENCOUNTER — Other Ambulatory Visit: Payer: Self-pay | Admitting: Family Medicine

## 2021-08-01 DIAGNOSIS — Z888 Allergy status to other drugs, medicaments and biological substances status: Secondary | ICD-10-CM | POA: Diagnosis not present

## 2021-08-01 DIAGNOSIS — H35012 Changes in retinal vascular appearance, left eye: Secondary | ICD-10-CM | POA: Diagnosis not present

## 2021-08-01 DIAGNOSIS — H4312 Vitreous hemorrhage, left eye: Secondary | ICD-10-CM | POA: Diagnosis not present

## 2021-08-01 DIAGNOSIS — H2513 Age-related nuclear cataract, bilateral: Secondary | ICD-10-CM | POA: Diagnosis not present

## 2021-08-01 DIAGNOSIS — H401133 Primary open-angle glaucoma, bilateral, severe stage: Secondary | ICD-10-CM | POA: Diagnosis not present

## 2021-08-07 DIAGNOSIS — D509 Iron deficiency anemia, unspecified: Secondary | ICD-10-CM | POA: Diagnosis not present

## 2021-08-07 DIAGNOSIS — D649 Anemia, unspecified: Secondary | ICD-10-CM | POA: Diagnosis not present

## 2021-08-07 DIAGNOSIS — K219 Gastro-esophageal reflux disease without esophagitis: Secondary | ICD-10-CM | POA: Diagnosis not present

## 2021-08-07 DIAGNOSIS — K279 Peptic ulcer, site unspecified, unspecified as acute or chronic, without hemorrhage or perforation: Secondary | ICD-10-CM | POA: Diagnosis not present

## 2021-08-07 DIAGNOSIS — K226 Gastro-esophageal laceration-hemorrhage syndrome: Secondary | ICD-10-CM | POA: Diagnosis not present

## 2021-09-12 DIAGNOSIS — Z79899 Other long term (current) drug therapy: Secondary | ICD-10-CM | POA: Diagnosis not present

## 2021-09-12 DIAGNOSIS — H401133 Primary open-angle glaucoma, bilateral, severe stage: Secondary | ICD-10-CM | POA: Diagnosis not present

## 2021-09-12 DIAGNOSIS — H35042 Retinal micro-aneurysms, unspecified, left eye: Secondary | ICD-10-CM | POA: Diagnosis not present

## 2021-09-12 DIAGNOSIS — H2513 Age-related nuclear cataract, bilateral: Secondary | ICD-10-CM | POA: Diagnosis not present

## 2021-09-12 DIAGNOSIS — H04123 Dry eye syndrome of bilateral lacrimal glands: Secondary | ICD-10-CM | POA: Diagnosis not present

## 2021-09-12 DIAGNOSIS — H4312 Vitreous hemorrhage, left eye: Secondary | ICD-10-CM | POA: Diagnosis not present

## 2021-09-12 DIAGNOSIS — H35012 Changes in retinal vascular appearance, left eye: Secondary | ICD-10-CM | POA: Diagnosis not present

## 2021-09-18 DIAGNOSIS — I739 Peripheral vascular disease, unspecified: Secondary | ICD-10-CM | POA: Diagnosis not present

## 2021-09-18 DIAGNOSIS — I6523 Occlusion and stenosis of bilateral carotid arteries: Secondary | ICD-10-CM | POA: Diagnosis not present

## 2021-09-18 DIAGNOSIS — I701 Atherosclerosis of renal artery: Secondary | ICD-10-CM | POA: Diagnosis not present

## 2021-09-25 DIAGNOSIS — I701 Atherosclerosis of renal artery: Secondary | ICD-10-CM | POA: Diagnosis not present

## 2021-09-25 DIAGNOSIS — I739 Peripheral vascular disease, unspecified: Secondary | ICD-10-CM | POA: Diagnosis not present

## 2021-09-25 DIAGNOSIS — I6523 Occlusion and stenosis of bilateral carotid arteries: Secondary | ICD-10-CM | POA: Diagnosis not present

## 2021-10-02 ENCOUNTER — Telehealth: Payer: Medicare HMO

## 2021-10-27 DIAGNOSIS — H35042 Retinal micro-aneurysms, unspecified, left eye: Secondary | ICD-10-CM | POA: Diagnosis not present

## 2021-10-27 DIAGNOSIS — Z79899 Other long term (current) drug therapy: Secondary | ICD-10-CM | POA: Diagnosis not present

## 2021-10-27 DIAGNOSIS — H2513 Age-related nuclear cataract, bilateral: Secondary | ICD-10-CM | POA: Diagnosis not present

## 2021-10-27 DIAGNOSIS — H401133 Primary open-angle glaucoma, bilateral, severe stage: Secondary | ICD-10-CM | POA: Diagnosis not present

## 2021-10-31 ENCOUNTER — Ambulatory Visit (INDEPENDENT_AMBULATORY_CARE_PROVIDER_SITE_OTHER): Payer: Medicare HMO | Admitting: Pharmacist

## 2021-10-31 DIAGNOSIS — I10 Essential (primary) hypertension: Secondary | ICD-10-CM

## 2021-10-31 DIAGNOSIS — K219 Gastro-esophageal reflux disease without esophagitis: Secondary | ICD-10-CM

## 2021-10-31 DIAGNOSIS — E782 Mixed hyperlipidemia: Secondary | ICD-10-CM

## 2021-11-04 DIAGNOSIS — H04123 Dry eye syndrome of bilateral lacrimal glands: Secondary | ICD-10-CM | POA: Diagnosis not present

## 2021-11-04 DIAGNOSIS — H4312 Vitreous hemorrhage, left eye: Secondary | ICD-10-CM | POA: Diagnosis not present

## 2021-11-04 DIAGNOSIS — H2513 Age-related nuclear cataract, bilateral: Secondary | ICD-10-CM | POA: Diagnosis not present

## 2021-11-04 DIAGNOSIS — I10 Essential (primary) hypertension: Secondary | ICD-10-CM | POA: Diagnosis not present

## 2021-11-04 DIAGNOSIS — R69 Illness, unspecified: Secondary | ICD-10-CM | POA: Diagnosis not present

## 2021-11-04 DIAGNOSIS — H35012 Changes in retinal vascular appearance, left eye: Secondary | ICD-10-CM | POA: Diagnosis not present

## 2021-11-04 DIAGNOSIS — H401133 Primary open-angle glaucoma, bilateral, severe stage: Secondary | ICD-10-CM | POA: Diagnosis not present

## 2021-11-06 ENCOUNTER — Ambulatory Visit: Payer: Medicare HMO | Admitting: Pharmacist

## 2021-11-06 DIAGNOSIS — E782 Mixed hyperlipidemia: Secondary | ICD-10-CM

## 2021-11-06 DIAGNOSIS — I10 Essential (primary) hypertension: Secondary | ICD-10-CM

## 2021-11-06 DIAGNOSIS — K219 Gastro-esophageal reflux disease without esophagitis: Secondary | ICD-10-CM

## 2021-11-06 NOTE — Patient Instructions (Signed)
Visit Information  Thank you for taking time to visit with me today. Please don't hesitate to contact me if I can be of assistance to you before our next scheduled telephone appointment.  Following are the goals we discussed today:   Patient Goals/Self-Care Activities Over the next 90 days, patient will:  Take medications as prescribed and check blood pressure daily, document, and provide at future appointments  Follow Up Plan: Telephone follow up appointment with care management team member scheduled for:  3 months  Please call the care guide team at [phone] if you need to cancel or reschedule your appointment.    Patient verbalizes understanding of instructions and care plan provided today and agrees to view in [place]. Active MyChart status and patient understanding of how to access instructions and care plan via MyChart confirmed with patient.     Cassidy Jennings

## 2021-11-06 NOTE — Progress Notes (Signed)
Chronic Care Management Pharmacy Note  11/06/2021 Name:  Cassidy Jennings MRN:  [id number] DOB:  12/25/1940  Summary: addressed HTN, HLD, medication adherence. BP still up and down despite losartan added by nephrology. Caregiver Estill Bamberg answered call, and in the past week checked Cassidy Jennings's blood pressure as requested.  BP today was 164/99, other times SBP 190s.  Recommendations/Changes made from today's visit:  No changes at this time, recommend she call for an appointment with PCP to readdress blood pressure.  After reviewing previous attempts at BP control, may consider labetalol, or hydralazine.  Plan: f/u with pharmacist in  3 months  Subjective: Cassidy Jennings is an 81 y.o. year old female who is a primary patient of [name], DO.  The CCM team was consulted for assistance with disease management and care coordination needs.    Engaged with patient by telephone for follow up visit in response to provider referral for pharmacy case management and/or care coordination services.   Consent to Services:  The patient was given information about Chronic Care Management services, agreed to services, and gave verbal consent prior to initiation of services.  Please see initial visit note for detailed documentation.   Patient Care Team: [name], DO as PCP - General (Family Medicine) Darius Bump, [facility] as Pharmacist (Pharmacist)  Recent office visits:  09/18/20-Cody Zigmund Daniel, DO (PCP) Seen due to a fall. X-rays ordered.  Recommend icing and tylenol arthritis for pain.   Recent consult visits:  08/16/20-Karl A. Pleasant (Gastroenterology) Seen for Anemia. Labs ordered. Follow up in 1 year. 07/26/20-Shawn H. Raul Del (Vascular Specialist) Notes not available. 07/18/20-Claude Mellody Dance (Vascular Surgery) Notes not available. 06/24/20-John Lenon Curt (Neurology) Patient evaluation. Follow up in 6 months.   Hospital visits:  None in previous 6 months  Objective:  Lab  Results  Component Value Date   CREATININE 1.42 (H) 09/18/2020   CREATININE 1.18 (H) 11/10/2019   CREATININE 1.73 (H) 04/12/2018    Lab Results  Component Value Date   HGBA1C 6.4 (A) 04/12/2018       Component Value Date/Time   CHOL 165 04/12/2018 1100   TRIG 167 (H) 04/12/2018 1100   HDL 39 (L) 04/12/2018 1100   CHOLHDL 4.2 04/12/2018 1100   VLDL 29 09/02/2015 1025   LDLCALC 99 04/12/2018 1100   LDLDIRECT 127 (H) 11/17/2007 1538       Latest Ref Rng & Units 09/18/2020   12:36 PM 11/10/2019   10:26 AM 05/16/2019    3:47 PM  Hepatic Function  Total Protein 6.1 - 8.1 g/dL 7.1  5.8  7.0   AST 10 - 35 U/L 16  15    ALT 6 - 29 U/L 14  18    Total Bilirubin 0.2 - 1.2 mg/dL 0.7  0.4      Lab Results  Component Value Date/Time   TSH 2.38 09/18/2020 12:36 PM   TSH 1.97 05/16/2019 03:47 PM       Latest Ref Rng & Units 09/18/2020   12:36 PM 11/10/2019   10:26 AM 05/16/2019    3:47 PM  CBC  WBC 3.8 - 10.8 Thousand/uL 4.7  6.1  6.8   Hemoglobin 11.7 - 15.5 g/dL 12.6  8.7  11.7   Hematocrit 35.0 - 45.0 % 37.6  25.2  34.1   Platelets 140 - 400 Thousand/uL 210  290  226     Lab Results  Component Value Date/Time   VD25OH 62 05/16/2019 03:47 PM   VD25OH 60 08/05/2016  10:22 AM     Social History   Tobacco Use  Smoking Status Every Day   Packs/day: 0.25   Years: 63.00   Total pack years: 15.75   Types: Cigarettes  Smokeless Tobacco Never   BP Readings from Last 3 Encounters:  09/18/20 (!) 148/85  12/14/19 139/68  11/10/19 (!) 147/61   Pulse Readings from Last 3 Encounters:  09/18/20 84  12/14/19 97  11/10/19 85   Wt Readings from Last 3 Encounters:  09/18/20 119 lb 1.9 oz (54 kg)  12/14/19 139 lb (63 kg)  11/10/19 129 lb (58.5 kg)    Assessment: Review of patient past medical history, allergies, medications, health status, including review of consultants reports, laboratory and other test data, was performed as part of comprehensive evaluation and provision of  chronic care management services.   SDOH:  (Social Determinants of Health) assessments and interventions performed:    CCM Care Plan  Allergies  Allergen Reactions   Simvastatin Nausea And Vomiting    gerd gerd    Medications Reviewed Today     Reviewed by Darius Bump, [facility] (Pharmacist) on 10/31/21 at 1439  Med List Status: <None>   Medication Order Taking? Sig Documenting Provider Last Dose Status Informant  amLODipine (NORVASC) 10 MG tablet [id number] Yes TAKE 1 TABLET(10 MG) BY MOUTH DAILY [name], DO Taking Active   aspirin 81 MG tablet [id number] Yes Take 81 mg by mouth daily. [provider] Taking Active   Cholecalciferol (VITAMIN D3) 50 MCG (2000 UT) capsule [id number] Yes Take by mouth.  [provider] Taking Active   donepezil (ARICEPT) 10 MG tablet [id number] Yes TAKE 1 TABLET(10 MG) BY MOUTH AT BEDTIME AS NEEDED [name], DO Taking Active   latanoprost (XALATAN) 0.005 % ophthalmic solution [id number] Yes Place 1 drop into both eyes at bedtime. [provider] Taking Active   losartan (COZAAR) 100 MG tablet [id number] Yes TAKE 1 TABLET(100 MG) BY MOUTH DAILY [name], DO Taking Active   MULTIPLE VITAMIN PO [id number] Yes Take by mouth. [provider] Taking Active             Patient Active Problem List   Diagnosis Date Noted   Acute pain of right knee 09/22/2020   Venous insufficiency 12/14/2019   Blood loss anemia 11/10/2019   Mallory-Weiss tear 11/06/2019   Acute hyponatremia 11/04/2019   Renal artery stenosis ([place]) 12/29/2016   Osteopenia 09/23/2016   Post-menopausal 08/05/2016   PAD (peripheral artery disease) ([place]) 01/11/2015   CKD (chronic kidney disease) stage 3, GFR 30-59 ml/min ([place]) 01/01/2015   Weight loss 12/31/2014   Carotid artery disease ([place]) 08/01/2014   Tobacco abuse 07/13/2014   Prediabetes 09/12/2012   Cataract 05/10/2012   Glaucoma 05/10/2012   Hypertension    Hyperlipidemia     Alzheimer's dementia ([place]) 08/09/2009   PAP SMEAR, ABNORMAL 03/21/2009   GERD 03/19/2009   Hearing loss 11/07/2007    Immunization History  Administered Date(s) Administered   Influenza Split 03/22/2011, 03/31/2012   Influenza Whole 03/19/2009, 04/17/2010   Influenza, High Dose Seasonal PF 03/30/2019   Influenza,inj,Quad PF,6+ Mos 01/31/2015, 01/13/2017   Influenza-Unspecified 05/12/2015, 03/11/2018   PPD Test 02/26/2020   Pneumococcal Conjugate-13 01/31/2015   Pneumococcal Polysaccharide-23 04/21/2011   Tdap 04/21/2011   Zoster, Live 04/10/2012    Conditions to be addressed/monitored: HTN, HLD, and GERD  There are no care plans that you recently modified to display for this patient.      Medication Assistance:  None required.  Patient affirms current coverage meets needs.  Patient's preferred pharmacy is:  [id number] - [place], [place] - [address] AT [address] [place] [address] Phone: [phone] Fax: [phone]   Uses pill box? Yes - daughter in law manages, and administers doses. Lives with son & daughter in law. Pt endorses 100% compliance  Follow Up:  Patient agrees to Care Plan and Follow-up.  Plan: Telephone follow up appointment with care management team member scheduled for:  3 months  Larinda Buttery, PharmD Clinical Pharmacist [facility] [phone]

## 2021-11-07 DIAGNOSIS — E785 Hyperlipidemia, unspecified: Secondary | ICD-10-CM

## 2021-11-07 DIAGNOSIS — I10 Essential (primary) hypertension: Secondary | ICD-10-CM | POA: Diagnosis not present

## 2021-11-10 ENCOUNTER — Ambulatory Visit (INDEPENDENT_AMBULATORY_CARE_PROVIDER_SITE_OTHER): Payer: Medicare HMO | Admitting: Family Medicine

## 2021-11-10 VITALS — BP 122/61 | HR 73 | Ht 64.0 in | Wt 116.0 lb

## 2021-11-10 DIAGNOSIS — I10 Essential (primary) hypertension: Secondary | ICD-10-CM

## 2021-11-10 NOTE — Progress Notes (Signed)
Patient is here for blood pressure check.   Previous BP was elevated at home. Pt is given medications at night. Diastolic has been 833'A+ at home on wrist monitor.  1st BP today: 154/62  2nd BP today (after 10  minutes): 122/61  Denies chest pain, dizziness, shortness of breath, severe headache, or nosebleeds. Taking medication as prescribed. Denies missed doses.   No change in treatment plan at this time. Pt's office visit scheduled for 12/04/21

## 2021-11-10 NOTE — Progress Notes (Signed)
Medical screening examination/treatment was performed by qualified clinical staff member and as supervising physician I was immediately available for consultation/collaboration. I have reviewed documentation and agree with assessment and plan.  Maelynn Moroney, DO  

## 2021-12-04 ENCOUNTER — Ambulatory Visit (INDEPENDENT_AMBULATORY_CARE_PROVIDER_SITE_OTHER): Payer: Medicare HMO | Admitting: Family Medicine

## 2021-12-04 ENCOUNTER — Encounter: Payer: Self-pay | Admitting: Family Medicine

## 2021-12-04 VITALS — BP 179/61 | HR 80 | Ht 64.0 in | Wt 116.0 lb

## 2021-12-04 DIAGNOSIS — I10 Essential (primary) hypertension: Secondary | ICD-10-CM | POA: Diagnosis not present

## 2021-12-04 DIAGNOSIS — F02B0 Dementia in other diseases classified elsewhere, moderate, without behavioral disturbance, psychotic disturbance, mood disturbance, and anxiety: Secondary | ICD-10-CM

## 2021-12-04 DIAGNOSIS — G301 Alzheimer's disease with late onset: Secondary | ICD-10-CM

## 2021-12-04 DIAGNOSIS — I701 Atherosclerosis of renal artery: Secondary | ICD-10-CM | POA: Diagnosis not present

## 2021-12-04 DIAGNOSIS — R69 Illness, unspecified: Secondary | ICD-10-CM | POA: Diagnosis not present

## 2021-12-04 DIAGNOSIS — Z720 Tobacco use: Secondary | ICD-10-CM

## 2021-12-04 DIAGNOSIS — N1831 Chronic kidney disease, stage 3a: Secondary | ICD-10-CM | POA: Diagnosis not present

## 2021-12-04 MED ORDER — LOSARTAN POTASSIUM-HCTZ 50-12.5 MG PO TABS: 1.0000 | ORAL_TABLET | Freq: Every day | ORAL | 1 refills | Status: DC

## 2021-12-04 NOTE — Patient Instructions (Signed)
Stop losartan 25mg   Start losartan/hctz 50/12.5mg  daily.  Follow up in 2 weeks for bp check and recheck of kidneys.

## 2021-12-04 NOTE — Assessment & Plan Note (Signed)
Seen by neurology and currently on donepezil.  We will continue this for now.

## 2021-12-04 NOTE — Assessment & Plan Note (Signed)
Blood pressure remains elevated.  We will increase losartan slightly to 50 mg and add HCTZ.  Continue amlodipine.  Follow-up in 2 weeks.  Check renal function again at that time.

## 2021-12-04 NOTE — Assessment & Plan Note (Signed)
Counseled on cessation however she has no intention in quitting at this time.

## 2021-12-04 NOTE — Assessment & Plan Note (Signed)
Followed by nephrology.  Renal function has been stable.

## 2021-12-04 NOTE — Progress Notes (Signed)
Cassidy Jennings - 81 y.o. female MRN [id number]  Date of birth: 08-Jun-1940  Subjective Chief Complaint  Patient presents with   Hypertension    HPI Cassidy Jennings is a 81 year old female here today for follow-up visit.  She is accompanied by her daughter and granddaughter today.  She has history of Alzheimer's dementia and is currently on donepezil.  She is followed by neurology for management of this.  Blood pressure currently treated with amlodipine 10 mg as well as losartan 25 mg daily.  She is previously on losartan 100 mg daily however this was stopped at 1 point due to worsening renal function.  Her nephrologist had recently added losartan back on 25 mg.  Duplex ultrasound from 2018 shows greater than 60% stenosis bilaterally in the renal arteries however repeat duplex demonstrated only 60% stenosis on the right.  She has follow-up with vascular surgery.  Blood pressure is elevated today.  She does continue to smoke daily.  Smokes about half pack per day.  Not interested in quitting at this time.  She denies chest pain, shortness of breath, palpitations, headaches or vision changes.  ROS:  A comprehensive ROS was completed and negative except as noted per HPI  Allergies  Allergen Reactions   Simvastatin Nausea And Vomiting    GERD    Past Medical History:  Diagnosis Date   Chronic kidney disease    Hyperlipidemia    Hypertension    Prediabetes     Past Surgical History:  Procedure Laterality Date   TUBAL LIGATION      Social History   Socioeconomic History   Marital status: Widowed    Spouse name: Not on file   Number of children: 6   Years of education: 70   Highest education level: 12th grade  Occupational History   Occupation: cleaning lady    Comment: retired  Tobacco Use   Smoking status: Every Day    Packs/day: 0.25    Years: 63.00    Total pack years: 15.75    Types: Cigarettes   Smokeless tobacco: Never  Vaping Use   Vaping Use: Never used   Substance and Sexual Activity   Alcohol use: No    Alcohol/week: 0.0 standard drinks of alcohol   Drug use: No   Sexual activity: Not Currently  Other Topics Concern   Not on file  Social History Narrative   She smokes but has multiple family members that smoke. Stays active but no regular exercise. Walks to mail box and back daily is how she gets her exercise.   Social Determinants of Health   Financial Resource Strain: Low Risk  (10/17/2018)   Overall Financial Resource Strain (CARDIA)    Difficulty of Paying Living Expenses: Not hard at all  Food Insecurity: No Food Insecurity (10/17/2018)   Hunger Vital Sign    Worried About Running Out of Food in the Last Year: Never true    Ran Out of Food in the Last Year: Never true  Transportation Needs: No Transportation Needs (10/17/2018)   PRAPARE - Hydrologist (Medical): No    Lack of Transportation (Non-Medical): No  Physical Activity: Insufficiently Active (10/17/2018)   Exercise Vital Sign    Days of Exercise per Week: 3 days    Minutes of Exercise per Session: 10 min  Stress: No Stress Concern Present (10/17/2018)   [place]    Feeling of Stress : Not at  all  Social Connections: Somewhat Isolated (10/17/2018)   Social Connection and Isolation Panel [NHANES]    Frequency of Communication with Friends and Family: More than three times a week    Frequency of Social Gatherings with Friends and Family: Twice a week    Attends Religious Services: 1 to 4 times per year    Active Member of [employer] or Organizations: No    Attends Archivist Meetings: Never    Marital Status: Widowed    Family History  Problem Relation Age of Onset   Hypertension Mother    Heart attack Mother 20   Heart disease Mother    Hypertension Sister    Hypertension Daughter    Hypertension Son     Health Maintenance  Topic Date Due   TETANUS/TDAP   12/05/2021 (Originally 04/20/2021)   Zoster Vaccines- Shingrix (1 of 2) 02/10/2022 (Originally 05/28/1960)   COVID-19 Vaccine (1) 02/11/2022 (Originally 11/25/1940)   INFLUENZA VACCINE  12/09/2021   Pneumonia Vaccine 36+ Years old  Completed   DEXA SCAN  Completed   HPV VACCINES  Aged Out     ----------------------------------------------------------------------------------------------------------------------------------------------------------------------------------------------------------------- Physical Exam BP (!) 179/61 (BP Location: Left Arm, Patient Position: Sitting, Cuff Size: Small)   Pulse 80   Ht 5\' 4"  (1.626 m)   Wt 116 lb (52.6 kg)   SpO2 97%   BMI 19.91 kg/m   Physical Exam Constitutional:      Appearance: Normal appearance.  Eyes:     General: No scleral icterus. Cardiovascular:     Rate and Rhythm: Normal rate and regular rhythm.  Musculoskeletal:     Cervical back: Neck supple.  Neurological:     Mental Status: She is alert.  Psychiatric:        Mood and Affect: Mood normal.        Behavior: Behavior normal.     ------------------------------------------------------------------------------------------------------------------------------------------------------------------------------------------------------------------- Assessment and Plan  Renal artery stenosis ([place]) Follow vascular surgery.  Duplex in 2022 with only right-sided stenosis   Hypertension Blood pressure remains elevated.  We will increase losartan slightly to 50 mg and add HCTZ.  Continue amlodipine.  Follow-up in 2 weeks.  Check renal function again at that time.  CKD (chronic kidney disease) stage 3, GFR 30-59 ml/min Followed by nephrology.  Renal function has been stable.  Tobacco abuse Counseled on cessation however she has no intention in quitting at this time.  Alzheimer's dementia [facility]) Seen by neurology and currently on donepezil.  We will continue this for now.   Meds  ordered this encounter  Medications   losartan-hydrochlorothiazide (HYZAAR) 50-12.5 MG tablet    Sig: Take 1 tablet by mouth daily.    Dispense:  90 tablet    Refill:  1    Return for 2 week nurse visit-BP check/labs.    This visit occurred during the SARS-CoV-2 public health emergency.  Safety protocols were in place, including screening questions prior to the visit, additional usage of staff PPE, and extensive cleaning of exam room while observing appropriate contact time as indicated for disinfecting solutions.

## 2021-12-04 NOTE — Assessment & Plan Note (Signed)
Follow vascular surgery.  Duplex in 2022 with only right-sided stenosis

## 2021-12-18 ENCOUNTER — Ambulatory Visit: Payer: Medicare HMO

## 2021-12-19 ENCOUNTER — Ambulatory Visit (INDEPENDENT_AMBULATORY_CARE_PROVIDER_SITE_OTHER): Payer: Medicare HMO | Admitting: Medical-Surgical

## 2021-12-19 VITALS — BP 130/79 | HR 74

## 2021-12-19 DIAGNOSIS — N1831 Chronic kidney disease, stage 3a: Secondary | ICD-10-CM | POA: Diagnosis not present

## 2021-12-19 DIAGNOSIS — I10 Essential (primary) hypertension: Secondary | ICD-10-CM

## 2021-12-19 NOTE — Progress Notes (Signed)
Agree with documentation as below.  ___________________________________________ Travanti Mcmanus L. Jaxie Racanelli, DNP, APRN, FNP-BC Primary Care and Sports Medicine [facility]  

## 2021-12-19 NOTE — Progress Notes (Signed)
   Established Patient Office Visit  Subjective   Patient ID: Cassidy Jennings, female    DOB: August 24, 1940  Age: 81 y.o. MRN: [id number]  Chief Complaint  Patient presents with   Hypertension    HPI  Cassidy Jennings is here for blood pressure check. She reports home readings have been around 130/63. Denies chest pain, shortness of breath or dizziness.   ROS    Objective:     BP 130/79   Pulse 74   SpO2 97%    Physical Exam   No results found for any visits on 12/19/21.    The ASCVD Risk score (Arnett DK, et al., 2019) failed to calculate for the following reasons:   The 2019 ASCVD risk score is only valid for ages 1 to 35    Assessment & Plan:  Hypertension - Patient advised to continue current medications as directed and follow up with Dr Zigmund Daniel in 3 months. She did have labs today.   Problem List Items Addressed This Visit       Unprioritized   Hypertension - Primary (Chronic)    Return in about 3 months (around 03/21/2022) for HTN with Dr Zigmund Daniel. Durene Romans, Monico Blitz, [place]

## 2021-12-20 LAB — COMPLETE METABOLIC PANEL WITH GFR
AG Ratio: 1.3 (calc) (ref 1.0–2.5)
ALT: 11 U/L (ref 6–29)
AST: 16 U/L (ref 10–35)
Albumin: 3.8 g/dL (ref 3.6–5.1)
Alkaline phosphatase (APISO): 72 U/L (ref 37–153)
BUN/Creatinine Ratio: 15 (calc) (ref 6–22)
BUN: 40 mg/dL — ABNORMAL HIGH (ref 7–25)
CO2: 33 mmol/L — ABNORMAL HIGH (ref 20–32)
Calcium: 9.6 mg/dL (ref 8.6–10.4)
Chloride: 104 mmol/L (ref 98–110)
Creat: 2.66 mg/dL — ABNORMAL HIGH (ref 0.60–0.95)
Globulin: 3 g/dL (calc) (ref 1.9–3.7)
Glucose, Bld: 98 mg/dL (ref 65–99)
Potassium: 5.2 mmol/L (ref 3.5–5.3)
Sodium: 142 mmol/L (ref 135–146)
Total Bilirubin: 0.4 mg/dL (ref 0.2–1.2)
Total Protein: 6.8 g/dL (ref 6.1–8.1)
eGFR: 18 mL/min/{1.73_m2} — ABNORMAL LOW (ref >=60)

## 2021-12-20 LAB — CBC WITH DIFFERENTIAL/PLATELET
Absolute Monocytes: 402 cells/uL (ref 200–950)
Basophils Absolute: 42 cells/uL (ref 0–200)
Basophils Relative: 0.7 %
Eosinophils Absolute: 132 cells/uL (ref 15–500)
Eosinophils Relative: 2.2 %
HCT: 37.1 % (ref 35.0–45.0)
Hemoglobin: 12.5 g/dL (ref 11.7–15.5)
Lymphs Abs: 1986 cells/uL (ref 850–3900)
MCH: 32 pg (ref 27.0–33.0)
MCHC: 33.7 g/dL (ref 32.0–36.0)
MCV: 94.9 fL (ref 80.0–100.0)
MPV: 10.4 fL (ref 7.5–12.5)
Monocytes Relative: 6.7 %
Neutro Abs: 3438 cells/uL (ref 1500–7800)
Neutrophils Relative %: 57.3 %
Platelets: 224 10*3/uL (ref 140–400)
RBC: 3.91 10*6/uL (ref 3.80–5.10)
RDW: 12.4 % (ref 11.0–15.0)
Total Lymphocyte: 33.1 %
WBC: 6 10*3/uL (ref 3.8–10.8)

## 2021-12-22 ENCOUNTER — Other Ambulatory Visit: Payer: Self-pay | Admitting: Family Medicine

## 2021-12-22 DIAGNOSIS — N1831 Chronic kidney disease, stage 3a: Secondary | ICD-10-CM

## 2021-12-24 DIAGNOSIS — I6523 Occlusion and stenosis of bilateral carotid arteries: Secondary | ICD-10-CM | POA: Diagnosis not present

## 2021-12-24 DIAGNOSIS — N1831 Chronic kidney disease, stage 3a: Secondary | ICD-10-CM | POA: Diagnosis not present

## 2021-12-24 DIAGNOSIS — R4189 Other symptoms and signs involving cognitive functions and awareness: Secondary | ICD-10-CM | POA: Diagnosis not present

## 2021-12-24 DIAGNOSIS — I701 Atherosclerosis of renal artery: Secondary | ICD-10-CM | POA: Diagnosis not present

## 2021-12-25 ENCOUNTER — Ambulatory Visit: Payer: Medicare HMO

## 2021-12-25 DIAGNOSIS — N1831 Chronic kidney disease, stage 3a: Secondary | ICD-10-CM | POA: Diagnosis not present

## 2021-12-26 ENCOUNTER — Other Ambulatory Visit: Payer: Self-pay | Admitting: Family Medicine

## 2021-12-26 ENCOUNTER — Ambulatory Visit: Payer: Medicare HMO

## 2021-12-26 DIAGNOSIS — N184 Chronic kidney disease, stage 4 (severe): Secondary | ICD-10-CM

## 2021-12-26 LAB — BASIC METABOLIC PANEL
BUN/Creatinine Ratio: 12 (calc) (ref 6–22)
BUN: 33 mg/dL — ABNORMAL HIGH (ref 7–25)
CO2: 33 mmol/L — ABNORMAL HIGH (ref 20–32)
Calcium: 9.6 mg/dL (ref 8.6–10.4)
Chloride: 102 mmol/L (ref 98–110)
Creat: 2.83 mg/dL — ABNORMAL HIGH (ref 0.60–0.95)
Glucose, Bld: 117 mg/dL — ABNORMAL HIGH (ref 65–99)
Potassium: 4.8 mmol/L (ref 3.5–5.3)
Sodium: 140 mmol/L (ref 135–146)

## 2022-01-13 DIAGNOSIS — E871 Hypo-osmolality and hyponatremia: Secondary | ICD-10-CM | POA: Diagnosis not present

## 2022-01-13 DIAGNOSIS — I10 Essential (primary) hypertension: Secondary | ICD-10-CM | POA: Diagnosis not present

## 2022-01-13 DIAGNOSIS — I701 Atherosclerosis of renal artery: Secondary | ICD-10-CM | POA: Diagnosis not present

## 2022-01-13 DIAGNOSIS — N1831 Chronic kidney disease, stage 3a: Secondary | ICD-10-CM | POA: Diagnosis not present

## 2022-01-15 ENCOUNTER — Ambulatory Visit (INDEPENDENT_AMBULATORY_CARE_PROVIDER_SITE_OTHER): Payer: Medicare HMO | Admitting: Pharmacist

## 2022-01-15 DIAGNOSIS — I10 Essential (primary) hypertension: Secondary | ICD-10-CM

## 2022-01-15 DIAGNOSIS — E782 Mixed hyperlipidemia: Secondary | ICD-10-CM

## 2022-01-15 NOTE — Progress Notes (Unsigned)
Chronic Care Management Pharmacy Note  01/15/2022 Name:  Cassidy Jennings MRN:  [id number] DOB:  1941/03/03  Summary: addressed HTN, HLD, medication adherence. Blood pressures had been elevated,caregiver Estill Bamberg took patient to PCP & nurse visits to optimize control, as well as nephrologist.  Nephrologist discontinued losartan-hctz, suspected due to worsened renal function/electrolytes.  Estill Bamberg describes that home BP was systolic 300P, but now 233-007M systolic since stopping the medication.  Recommendations/Changes made from today's visit:  No changes at this time, patient is pending repeat labs, and further guidance on medication from nephrology - agree with plan in place.  Plan: f/u with pharmacist in 1 month  Subjective: Cassidy Jennings is an 81 y.o. year old female who is a primary patient of [name], DO.  The CCM team was consulted for assistance with disease management and care coordination needs.    Engaged with patient by telephone for follow up visit in response to provider referral for pharmacy case management and/or care coordination services.   Consent to Services:  The patient was given information about Chronic Care Management services, agreed to services, and gave verbal consent prior to initiation of services.  Please see initial visit note for detailed documentation.   Patient Care Team: [name], DO as PCP - General (Family Medicine) Darius Bump, [facility] as Pharmacist (Pharmacist)  Recent office visits:  09/18/20-Cody Zigmund Daniel, DO (PCP) Seen due to a fall. X-rays ordered.  Recommend icing and tylenol arthritis for pain.   Recent consult visits:  08/16/20-Karl A. Pleasant (Gastroenterology) Seen for Anemia. Labs ordered. Follow up in 1 year. 07/26/20-Shawn H. Raul Del (Vascular Specialist) Notes not available. 07/18/20-Claude Mellody Dance (Vascular Surgery) Notes not available. 06/24/20-John Lenon Curt (Neurology) Patient evaluation. Follow up in 6  months.   Hospital visits:  None in previous 6 months  Objective:  Lab Results  Component Value Date   CREATININE 2.83 (H) 12/25/2021   CREATININE 2.66 (H) 12/19/2021   CREATININE 1.42 (H) 09/18/2020    Lab Results  Component Value Date   HGBA1C 6.4 (A) 04/12/2018       Component Value Date/Time   CHOL 165 04/12/2018 1100   TRIG 167 (H) 04/12/2018 1100   HDL 39 (L) 04/12/2018 1100   CHOLHDL 4.2 04/12/2018 1100   VLDL 29 09/02/2015 1025   LDLCALC 99 04/12/2018 1100   LDLDIRECT 127 (H) 11/17/2007 1538       Latest Ref Rng & Units 12/19/2021   12:00 AM 09/18/2020   12:36 PM 11/10/2019   10:26 AM  Hepatic Function  Total Protein 6.1 - 8.1 g/dL 6.8  7.1  5.8   AST 10 - 35 U/L 16  16  15    ALT 6 - 29 U/L 11  14  18    Total Bilirubin 0.2 - 1.2 mg/dL 0.4  0.7  0.4     Lab Results  Component Value Date/Time   TSH 2.38 09/18/2020 12:36 PM   TSH 1.97 05/16/2019 03:47 PM       Latest Ref Rng & Units 12/19/2021   12:00 AM 09/18/2020   12:36 PM 11/10/2019   10:26 AM  CBC  WBC 3.8 - 10.8 Thousand/uL 6.0  4.7  6.1   Hemoglobin 11.7 - 15.5 g/dL 12.5  12.6  8.7   Hematocrit 35.0 - 45.0 % 37.1  37.6  25.2   Platelets 140 - 400 Thousand/uL 224  210  290     Lab Results  Component Value Date/Time   VD25OH 62 05/16/2019 03:47  PM   VD25OH 60 08/05/2016 10:22 AM     Social History   Tobacco Use  Smoking Status Every Day   Packs/day: 0.25   Years: 63.00   Total pack years: 15.75   Types: Cigarettes  Smokeless Tobacco Never   BP Readings from Last 3 Encounters:  12/19/21 130/79  12/04/21 (!) 179/61  11/10/21 122/61   Pulse Readings from Last 3 Encounters:  12/19/21 74  12/04/21 80  11/10/21 73   Wt Readings from Last 3 Encounters:  12/04/21 116 lb (52.6 kg)  11/10/21 116 lb (52.6 kg)  09/18/20 119 lb 1.9 oz (54 kg)    Assessment: Review of patient past medical history, allergies, medications, health status, including review of consultants reports, laboratory  and other test data, was performed as part of comprehensive evaluation and provision of chronic care management services.   SDOH:  (Social Determinants of Health) assessments and interventions performed:  SDOH Interventions    Flowsheet Row Office Visit from 09/18/2020 in [facility]  SDOH Interventions   Depression Interventions/Treatment  Counseling       CCM Care Plan  Allergies  Allergen Reactions   Simvastatin Nausea And Vomiting    GERD    Medications Reviewed Today     Reviewed by Narda Rutherford, CMA (Certified Medical Assistant) on 12/19/21 at 1440  Med List Status: <None>   Medication Order Taking? Sig Documenting Provider Last Dose Status Informant  amLODipine (NORVASC) 10 MG tablet [id number] Yes TAKE 1 TABLET(10 MG) BY MOUTH DAILY [name], DO Taking Active   aspirin 81 MG tablet [id number] Yes Take 81 mg by mouth daily. [provider] Taking Active   Cholecalciferol (VITAMIN D3) 50 MCG (2000 UT) capsule [id number] Yes Take by mouth.  [provider] Taking Active   donepezil (ARICEPT) 10 MG tablet [id number] Yes TAKE 1 TABLET(10 MG) BY MOUTH AT BEDTIME AS NEEDED [name], DO Taking Active   latanoprost (XALATAN) 0.005 % ophthalmic solution [id number] Yes Place 1 drop into both eyes at bedtime. [provider] Taking Active   losartan-hydrochlorothiazide (HYZAAR) 50-12.5 MG tablet [id number] Yes Take 1 tablet by mouth daily. [name], DO Taking Active   MULTIPLE VITAMIN PO [id number] Yes Take by mouth. [provider] Taking Active             Patient Active Problem List   Diagnosis Date Noted   Venous insufficiency 12/14/2019   Blood loss anemia 11/10/2019   Mallory-Weiss tear 11/06/2019   Acute hyponatremia 11/04/2019   Renal artery stenosis ([place]) 12/29/2016   Osteopenia 09/23/2016   Post-menopausal 08/05/2016   PAD (peripheral artery disease) ([place]) 01/11/2015   CKD  (chronic kidney disease) stage 3, GFR 30-59 ml/min ([place]) 01/01/2015   Weight loss 12/31/2014   Carotid artery disease ([place]) 08/01/2014   Tobacco abuse 07/13/2014   Prediabetes 09/12/2012   Cataract 05/10/2012   Glaucoma 05/10/2012   Hypertension    Hyperlipidemia    Alzheimer's dementia ([place]) 08/09/2009   GERD 03/19/2009   Hearing loss 11/07/2007    Immunization History  Administered Date(s) Administered   Influenza Split 03/22/2011, 03/31/2012   Influenza Whole 03/19/2009, 04/17/2010   Influenza, High Dose Seasonal PF 03/30/2019   Influenza,inj,Quad PF,6+ Mos 01/31/2015, 01/13/2017   Influenza-Unspecified 05/12/2015, 03/11/2018   PPD Test 02/26/2020   Pneumococcal Conjugate-13 01/31/2015   Pneumococcal Polysaccharide-23 04/21/2011   Tdap 04/21/2011   Zoster, Live 04/10/2012    Conditions to be addressed/monitored: HTN, HLD, and  GERD  There are no care plans that you recently modified to display for this patient.      Medication Assistance: None required.  Patient affirms current coverage meets needs.  Patient's preferred pharmacy is:  [id number] - [place], [place] - [address] AT [address] [place] [address] Phone: [phone] Fax: [phone]   Uses pill box? Yes - daughter in law manages, and administers doses. Lives with son & daughter in law. Pt endorses 100% compliance  Follow Up:  Patient agrees to Care Plan and Follow-up.  Plan: Telephone follow up appointment with care management team member scheduled for:  1 months  Larinda Buttery, PharmD Clinical Pharmacist [facility] [phone]

## 2022-01-16 NOTE — Patient Instructions (Signed)
Visit Information  Thank you for taking time to visit with me today. Please don't hesitate to contact me if I can be of assistance to you before our next scheduled telephone appointment.  Following are the goals we discussed today:   Patient Goals/Self-Care Activities Over the next 30 days, patient will:  Take medications as prescribed and check blood pressure daily, document, and provide at future appointments  Follow Up Plan: Telephone follow up appointment with care management team member scheduled for:  1 month Please call the care guide team at [phone] if you need to cancel or reschedule your appointment.    The patient verbalized understanding of instructions, educational materials, and care plan provided today and agreed to receive a mailed copy of patient instructions, educational materials, and care plan.   Cassidy Jennings

## 2022-01-27 DIAGNOSIS — I701 Atherosclerosis of renal artery: Secondary | ICD-10-CM | POA: Diagnosis not present

## 2022-01-27 DIAGNOSIS — I10 Essential (primary) hypertension: Secondary | ICD-10-CM | POA: Diagnosis not present

## 2022-01-27 DIAGNOSIS — E871 Hypo-osmolality and hyponatremia: Secondary | ICD-10-CM | POA: Diagnosis not present

## 2022-01-27 DIAGNOSIS — N1831 Chronic kidney disease, stage 3a: Secondary | ICD-10-CM | POA: Diagnosis not present

## 2022-02-07 DIAGNOSIS — I10 Essential (primary) hypertension: Secondary | ICD-10-CM

## 2022-02-07 DIAGNOSIS — E782 Mixed hyperlipidemia: Secondary | ICD-10-CM

## 2022-02-11 ENCOUNTER — Other Ambulatory Visit: Payer: Self-pay

## 2022-02-11 MED ORDER — AMLODIPINE BESYLATE 10 MG PO TABS: ORAL_TABLET | ORAL | 1 refills | Status: AC

## 2022-02-26 ENCOUNTER — Ambulatory Visit (INDEPENDENT_AMBULATORY_CARE_PROVIDER_SITE_OTHER): Payer: Medicare HMO | Admitting: Pharmacist

## 2022-02-26 DIAGNOSIS — I10 Essential (primary) hypertension: Secondary | ICD-10-CM

## 2022-02-26 DIAGNOSIS — E782 Mixed hyperlipidemia: Secondary | ICD-10-CM

## 2022-02-26 NOTE — Progress Notes (Signed)
Chronic Care Management Pharmacy Note  02/26/2022 Name:  Cassidy Jennings MRN:  [id number] DOB:  03/23/1941  Summary: addressed HTN, HLD, medication adherence.    Nephrologist discontinued losartan-hctz, due to acute on chronic renal impairment as well as concern for severe hyponatremia on hctz (which happened in past). Fill history appears with new labetalol 100mg  BID, caregiver not currently near medicine bottles to confirm, but she believes this is correct.  BP: 150s/80s with new medication, better from the original SBP 180s.  Recommendations/Changes made from today's visit:  No changes at this time, titrate labetalol dosing between PCP and nephrology  Plan: f/u with pharmacist in 3-6 months or as needed  Subjective: Cassidy Jennings is an 81 y.o. year old female who is a primary patient of [name], DO.  The CCM team was consulted for assistance with disease management and care coordination needs.    Engaged with patient by telephone for follow up visit in response to provider referral for pharmacy case management and/or care coordination services.   Consent to Services:  The patient was given information about Chronic Care Management services, agreed to services, and gave verbal consent prior to initiation of services.  Please see initial visit note for detailed documentation.   Patient Care Team: [name], DO as PCP - General (Family Medicine) Darius Bump, [facility] as Pharmacist (Pharmacist)  Recent office visits:  09/18/20-Cody Zigmund Daniel, DO (PCP) Seen due to a fall. X-rays ordered.  Recommend icing and tylenol arthritis for pain.   Recent consult visits:  08/16/20-Karl A. Pleasant (Gastroenterology) Seen for Anemia. Labs ordered. Follow up in 1 year. 07/26/20-Shawn H. Raul Del (Vascular Specialist) Notes not available. 07/18/20-Claude Mellody Dance (Vascular Surgery) Notes not available. 06/24/20-John Lenon Curt (Neurology) Patient evaluation. Follow up in 6  months.   Hospital visits:  None in previous 6 months  Objective:  Lab Results  Component Value Date   CREATININE 2.83 (H) 12/25/2021   CREATININE 2.66 (H) 12/19/2021   CREATININE 1.42 (H) 09/18/2020    Lab Results  Component Value Date   HGBA1C 6.4 (A) 04/12/2018       Component Value Date/Time   CHOL 165 04/12/2018 1100   TRIG 167 (H) 04/12/2018 1100   HDL 39 (L) 04/12/2018 1100   CHOLHDL 4.2 04/12/2018 1100   VLDL 29 09/02/2015 1025   LDLCALC 99 04/12/2018 1100   LDLDIRECT 127 (H) 11/17/2007 1538       Latest Ref Rng & Units 12/19/2021   12:00 AM 09/18/2020   12:36 PM 11/10/2019   10:26 AM  Hepatic Function  Total Protein 6.1 - 8.1 g/dL 6.8  7.1  5.8   AST 10 - 35 U/L 16  16  15    ALT 6 - 29 U/L 11  14  18    Total Bilirubin 0.2 - 1.2 mg/dL 0.4  0.7  0.4     Lab Results  Component Value Date/Time   TSH 2.38 09/18/2020 12:36 PM   TSH 1.97 05/16/2019 03:47 PM       Latest Ref Rng & Units 12/19/2021   12:00 AM 09/18/2020   12:36 PM 11/10/2019   10:26 AM  CBC  WBC 3.8 - 10.8 Thousand/uL 6.0  4.7  6.1   Hemoglobin 11.7 - 15.5 g/dL 12.5  12.6  8.7   Hematocrit 35.0 - 45.0 % 37.1  37.6  25.2   Platelets 140 - 400 Thousand/uL 224  210  290     Lab Results  Component Value Date/Time  VD25OH 62 05/16/2019 03:47 PM   VD25OH 60 08/05/2016 10:22 AM     Social History   Tobacco Use  Smoking Status Every Day   Packs/day: 0.25   Years: 63.00   Total pack years: 15.75   Types: Cigarettes  Smokeless Tobacco Never   BP Readings from Last 3 Encounters:  12/19/21 130/79  12/04/21 (!) 179/61  11/10/21 122/61   Pulse Readings from Last 3 Encounters:  12/19/21 74  12/04/21 80  11/10/21 73   Wt Readings from Last 3 Encounters:  12/04/21 116 lb (52.6 kg)  11/10/21 116 lb (52.6 kg)  09/18/20 119 lb 1.9 oz (54 kg)    Assessment: Review of patient past medical history, allergies, medications, health status, including review of consultants reports, laboratory  and other test data, was performed as part of comprehensive evaluation and provision of chronic care management services.   SDOH:  (Social Determinants of Health) assessments and interventions performed:  SDOH Interventions    Flowsheet Row Office Visit from 09/18/2020 in [place]  SDOH Interventions   Depression Interventions/Treatment  Counseling       CCM Care Plan  Allergies  Allergen Reactions   Simvastatin Nausea And Vomiting    GERD    Medications Reviewed Today     Reviewed by Darius Bump, [facility] (Pharmacist) on 01/16/22 at 1633  Med List Status: <None>   Medication Order Taking? Sig Documenting Provider Last Dose Status Informant  amLODipine (NORVASC) 10 MG tablet [id number] Yes TAKE 1 TABLET(10 MG) BY MOUTH DAILY [name], DO Taking Active   aspirin 81 MG tablet [id number] Yes Take 81 mg by mouth daily. [provider] Taking Active   Cholecalciferol (VITAMIN D3) 50 MCG (2000 UT) capsule [id number] Yes Take by mouth.  [provider] Taking Active   donepezil (ARICEPT) 10 MG tablet [id number] Yes TAKE 1 TABLET(10 MG) BY MOUTH AT BEDTIME AS NEEDED [name], DO Taking Active   latanoprost (XALATAN) 0.005 % ophthalmic solution [id number] Yes Place 1 drop into both eyes at bedtime. [provider] Taking Active   losartan-hydrochlorothiazide (HYZAAR) 50-12.5 MG tablet [id number] No Take 1 tablet by mouth daily.  Patient not taking: Reported on 01/16/2022   [name], DO Not Taking Active   MULTIPLE VITAMIN PO [id number] Yes Take by mouth. [provider] Taking Active             Patient Active Problem List   Diagnosis Date Noted   Venous insufficiency 12/14/2019   Blood loss anemia 11/10/2019   Mallory-Weiss tear 11/06/2019   Acute hyponatremia 11/04/2019   Renal artery stenosis ([place]) 12/29/2016   Osteopenia 09/23/2016   Post-menopausal 08/05/2016   PAD (peripheral artery  disease) ([place]) 01/11/2015   CKD (chronic kidney disease) stage 3, GFR 30-59 ml/min ([place]) 01/01/2015   Weight loss 12/31/2014   Carotid artery disease ([place]) 08/01/2014   Tobacco abuse 07/13/2014   Prediabetes 09/12/2012   Cataract 05/10/2012   Glaucoma 05/10/2012   Hypertension    Hyperlipidemia    Alzheimer's dementia ([place]) 08/09/2009   GERD 03/19/2009   Hearing loss 11/07/2007    Immunization History  Administered Date(s) Administered   Influenza Split 03/22/2011, 03/31/2012   Influenza Whole 03/19/2009, 04/17/2010   Influenza, High Dose Seasonal PF 03/30/2019   Influenza,inj,Quad PF,6+ Mos 01/31/2015, 01/13/2017   Influenza-Unspecified 05/12/2015, 03/11/2018   PPD Test 02/26/2020   Pneumococcal Conjugate-13 01/31/2015   Pneumococcal Polysaccharide-23 04/21/2011   Tdap 04/21/2011   Zoster,  Live 04/10/2012    Conditions to be addressed/monitored: HTN, HLD, and GERD  There are no care plans that you recently modified to display for this patient.      Medication Assistance: None required.  Patient affirms current coverage meets needs.  Patient's preferred pharmacy is:  [id number] - [place], [place] - [address] [place] [address] Phone: [phone] Fax: [phone]   Uses pill box? Yes - daughter in law manages, and administers doses. Lives with son & daughter in law. Pt endorses 100% compliance  Follow Up:  Patient agrees to Care Plan and Follow-up.  Plan: Telephone follow up appointment with care management team member scheduled for:  3-6 months  Larinda Buttery, PharmD Clinical Pharmacist [facility] [phone]

## 2022-03-04 DIAGNOSIS — I701 Atherosclerosis of renal artery: Secondary | ICD-10-CM | POA: Diagnosis not present

## 2022-03-04 DIAGNOSIS — I10 Essential (primary) hypertension: Secondary | ICD-10-CM | POA: Diagnosis not present

## 2022-03-04 DIAGNOSIS — N184 Chronic kidney disease, stage 4 (severe): Secondary | ICD-10-CM | POA: Diagnosis not present

## 2022-03-06 DIAGNOSIS — H2513 Age-related nuclear cataract, bilateral: Secondary | ICD-10-CM | POA: Diagnosis not present

## 2022-03-06 DIAGNOSIS — I10 Essential (primary) hypertension: Secondary | ICD-10-CM | POA: Diagnosis not present

## 2022-03-06 DIAGNOSIS — R69 Illness, unspecified: Secondary | ICD-10-CM | POA: Diagnosis not present

## 2022-03-06 DIAGNOSIS — H35042 Retinal micro-aneurysms, unspecified, left eye: Secondary | ICD-10-CM | POA: Diagnosis not present

## 2022-03-06 DIAGNOSIS — H35012 Changes in retinal vascular appearance, left eye: Secondary | ICD-10-CM | POA: Diagnosis not present

## 2022-03-06 DIAGNOSIS — H04123 Dry eye syndrome of bilateral lacrimal glands: Secondary | ICD-10-CM | POA: Diagnosis not present

## 2022-03-09 ENCOUNTER — Encounter: Payer: Self-pay | Admitting: *Deleted

## 2022-03-09 ENCOUNTER — Telehealth: Payer: Self-pay | Admitting: *Deleted

## 2022-03-10 NOTE — Patient Outreach (Signed)
  Care Coordination   Initial Visit Note   03/10/2022 Name: Cassidy Jennings MRN: [id number] DOB: 20-Sep-1940  Cassidy Jennings is a 81 y.o. year old female who sees [name], DO for primary care. I spoke with daughter-in-law/  Barrett Shell by phone today.  What matters to the patients health and wellness today?  "She is doing fine, no issues or concerns; she has a good appetite and her blood pressures look good.  She is taking all of her medications and she is followed by the kidney doctor, who recommended the changes to her medicines.  We will probably get her flu vaccine when we go see Dr. Zigmund Daniel on November 13"  Interventions provided; no further or ongoing care coordination needs identified today   Goals Addressed             This Visit's Progress    COMPLETED: Care Coordination activities- no follow up required   On track    Care Coordination Interventions: Evaluation of current treatment plan related to HTN and patient's adherence to plan as established by provider Advised patient to provide appropriate vaccination information to provider or CM team member at next visit Advised patient to continue monitoring blood pressures at home regularly- reviewed with caregiver today Reviewed medications with patient and discussed adherence: reports excellent medication adherence, denies questions/ concerns around current medications Reviewed scheduled/upcoming provider appointments including March 23, 2022- PCP Advised patient to discuss need for flu vaccine for 2023-24 flu/winter season with provider Assessed social determinant of health barriers          SDOH assessments and interventions completed:  Yes  SDOH Interventions Today    Flowsheet Row Most Recent Value  SDOH Interventions   Food Insecurity Interventions Intervention Not Indicated  Housing Interventions Intervention Not Indicated  Transportation Interventions Intervention Not Indicated  [family  provides transportation]       Care Coordination Interventions Activated:  Yes  Care Coordination Interventions:  Yes, provided   Follow up plan: No further intervention required.   Encounter Outcome:  Pt. Visit Completed   Oneta Rack, RN, BSN, CCRN Alumnus RN CM Care Coordination/ Transition of [place] Management [phone]: direct office

## 2022-03-23 ENCOUNTER — Ambulatory Visit (INDEPENDENT_AMBULATORY_CARE_PROVIDER_SITE_OTHER): Payer: Medicare HMO | Admitting: Family Medicine

## 2022-03-23 ENCOUNTER — Encounter: Payer: Self-pay | Admitting: Family Medicine

## 2022-03-23 VITALS — BP 106/73 | HR 73 | Ht 64.0 in | Wt 121.0 lb

## 2022-03-23 DIAGNOSIS — G301 Alzheimer's disease with late onset: Secondary | ICD-10-CM | POA: Diagnosis not present

## 2022-03-23 DIAGNOSIS — F02B0 Dementia in other diseases classified elsewhere, moderate, without behavioral disturbance, psychotic disturbance, mood disturbance, and anxiety: Secondary | ICD-10-CM

## 2022-03-23 DIAGNOSIS — R5383 Other fatigue: Secondary | ICD-10-CM | POA: Diagnosis not present

## 2022-03-23 DIAGNOSIS — N1831 Chronic kidney disease, stage 3a: Secondary | ICD-10-CM | POA: Diagnosis not present

## 2022-03-23 DIAGNOSIS — I10 Essential (primary) hypertension: Secondary | ICD-10-CM

## 2022-03-23 DIAGNOSIS — Z23 Encounter for immunization: Secondary | ICD-10-CM | POA: Diagnosis not present

## 2022-03-23 DIAGNOSIS — R350 Frequency of micturition: Secondary | ICD-10-CM | POA: Diagnosis not present

## 2022-03-23 DIAGNOSIS — R69 Illness, unspecified: Secondary | ICD-10-CM | POA: Diagnosis not present

## 2022-03-23 DIAGNOSIS — R7303 Prediabetes: Secondary | ICD-10-CM

## 2022-03-23 DIAGNOSIS — D649 Anemia, unspecified: Secondary | ICD-10-CM | POA: Diagnosis not present

## 2022-03-23 NOTE — Assessment & Plan Note (Signed)
Updating A1c today. ?

## 2022-03-23 NOTE — Assessment & Plan Note (Signed)
Blood pressure well controlled at this time.  Recommend continuation of current medications for management of hypertension. 

## 2022-03-23 NOTE — Assessment & Plan Note (Signed)
She is followed by nephrology.  Updating labs today.

## 2022-03-23 NOTE — Assessment & Plan Note (Signed)
Checking urinalysis today.

## 2022-03-23 NOTE — Assessment & Plan Note (Signed)
Stable at this time.  Continue donepezil at current strength.

## 2022-03-23 NOTE — Progress Notes (Signed)
Cassidy Jennings - 81 y.o. female MRN [id number]  Date of birth: [birth year]  Subjective Chief Complaint  Patient presents with   Follow-up    HPI Cassidy Jennings is a 81 y.o. female here today for follow up visit.   She reports that she has felt a little more fatigued recently.  She is having some urinary frequency as well.  Denies incontinence or odor to urine. Denies fever/chills.   Her BP is well controlled today.  Readings at home vary.  She is tolerating current medications well.  Denies chest pain, shortness of breath, palpitations, headache or vision changes.  She does continue to see nephrology as well for CKD3.    ROS:  A comprehensive ROS was completed and negative except as noted per HPI  Allergies  Allergen Reactions   Simvastatin Nausea And Vomiting    GERD    Past Medical History:  Diagnosis Date   Chronic kidney disease    Hyperlipidemia    Hypertension    Prediabetes     Past Surgical History:  Procedure Laterality Date   TUBAL LIGATION      Social History   Socioeconomic History   Marital status: Widowed    Spouse name: Not on file   Number of children: 6   Years of education: 33   Highest education level: 12th grade  Occupational History   Occupation: cleaning lady    Comment: retired  Tobacco Use   Smoking status: Every Day    Packs/day: 0.25    Years: 63.00    Total pack years: 15.75    Types: Cigarettes   Smokeless tobacco: Never  Vaping Use   Vaping Use: Never used  Substance and Sexual Activity   Alcohol use: No    Alcohol/week: 0.0 standard drinks of alcohol   Drug use: No   Sexual activity: Not Currently  Other Topics Concern   Not on file  Social History Narrative   She smokes but has multiple family members that smoke. Stays active but no regular exercise. Walks to mail box and back daily is how she gets her exercise.   Social Determinants of Health   Financial Resource Strain: Low Risk  (10/17/2018)   Overall Financial  Resource Strain (CARDIA)    Difficulty of Paying Living Expenses: Not hard at all  Food Insecurity: No Food Insecurity (03/09/2022)   Hunger Vital Sign    Worried About Running Out of Food in the Last Year: Never true    Ran Out of Food in the Last Year: Never true  Transportation Needs: No Transportation Needs (03/09/2022)   PRAPARE - Hydrologist (Medical): No    Lack of Transportation (Non-Medical): No  Physical Activity: Insufficiently Active (10/17/2018)   Exercise Vital Sign    Days of Exercise per Week: 3 days    Minutes of Exercise per Session: 10 min  Stress: No Stress Concern Present (10/17/2018)   [place]    Feeling of Stress : Not at all  Social Connections: Somewhat Isolated (10/17/2018)   Social Connection and Isolation Panel [NHANES]    Frequency of Communication with Friends and Family: More than three times a week    Frequency of Social Gatherings with Friends and Family: Twice a week    Attends Religious Services: 1 to 4 times per year    Active Member of [employer] or Organizations: No    Attends Archivist Meetings:  Never    Marital Status: Widowed    Family History  Problem Relation Age of Onset   Hypertension Mother    Heart attack Mother 31   Heart disease Mother    Hypertension Sister    Hypertension Daughter    Hypertension Son     Health Maintenance  Topic Date Due   COVID-19 Vaccine (1) Never done   Zoster Vaccines- Shingrix (1 of 2) Never done   Medicare Annual Wellness (AWV)  10/17/2019   TETANUS/TDAP  04/20/2021   Pneumonia Vaccine 22+ Years old  Completed   INFLUENZA VACCINE  Completed   DEXA SCAN  Completed   HPV VACCINES  Aged Out      ----------------------------------------------------------------------------------------------------------------------------------------------------------------------------------------------------------------- Physical Exam BP 106/73 (BP Location: Left Arm, Patient Position: Sitting, Cuff Size: Normal)   Pulse 73   Ht 5\' 4"  (1.626 m)   Wt 121 lb (54.9 kg)   SpO2 97%   BMI 20.77 kg/m   Physical Exam Constitutional:      Appearance: Normal appearance.  Eyes:     General: No scleral icterus. Cardiovascular:     Rate and Rhythm: Normal rate and regular rhythm.  Pulmonary:     Effort: Pulmonary effort is normal.     Breath sounds: Normal breath sounds.  Musculoskeletal:     Cervical back: Neck supple.  Neurological:     General: No focal deficit present.     Mental Status: She is alert.  Psychiatric:        Mood and Affect: Mood normal.        Behavior: Behavior normal.     ------------------------------------------------------------------------------------------------------------------------------------------------------------------------------------------------------------------- Assessment and Plan  Hypertension Blood pressure well controlled at this time.  Recommend continuation of current medications for management of hypertension.  Alzheimer's dementia ([place]) Stable at this time.  Continue donepezil at current strength.  CKD (chronic kidney disease) stage 3, GFR 30-59 ml/min She is followed by nephrology.  Updating labs today.  Prediabetes Updating A1c today.  Urinary frequency Checking urinalysis today.   No orders of the defined types were placed in this encounter.   Return in about 6 months (around 09/21/2022) for HTN.    This visit occurred during the SARS-CoV-2 public health emergency.  Safety protocols were in place, including screening questions prior to the visit, additional usage of staff PPE, and extensive cleaning of exam room while observing  appropriate contact time as indicated for disinfecting solutions.

## 2022-03-24 DIAGNOSIS — R5383 Other fatigue: Secondary | ICD-10-CM | POA: Diagnosis not present

## 2022-03-24 DIAGNOSIS — N1831 Chronic kidney disease, stage 3a: Secondary | ICD-10-CM | POA: Diagnosis not present

## 2022-03-24 DIAGNOSIS — R7303 Prediabetes: Secondary | ICD-10-CM | POA: Diagnosis not present

## 2022-03-24 DIAGNOSIS — R350 Frequency of micturition: Secondary | ICD-10-CM | POA: Diagnosis not present

## 2022-03-24 LAB — IRON,TIBC AND FERRITIN PANEL
%SAT: 35 % (calc) (ref 16–45)
Ferritin: 31 ng/mL (ref 16–288)
Iron: 110 ug/dL (ref 45–160)
TIBC: 317 mcg/dL (calc) (ref 250–450)

## 2022-03-24 LAB — CBC WITH DIFFERENTIAL/PLATELET
Absolute Monocytes: 360 cells/uL (ref 200–950)
Basophils Absolute: 31 cells/uL (ref 0–200)
Basophils Relative: 0.5 %
Eosinophils Absolute: 186 cells/uL (ref 15–500)
Eosinophils Relative: 3 %
HCT: 35.4 % (ref 35.0–45.0)
Hemoglobin: 12.1 g/dL (ref 11.7–15.5)
Lymphs Abs: 1432 cells/uL (ref 850–3900)
MCH: 32 pg (ref 27.0–33.0)
MCHC: 34.2 g/dL (ref 32.0–36.0)
MCV: 93.7 fL (ref 80.0–100.0)
MPV: 10.4 fL (ref 7.5–12.5)
Monocytes Relative: 5.8 %
Neutro Abs: 4191 cells/uL (ref 1500–7800)
Neutrophils Relative %: 67.6 %
Platelets: 247 10*3/uL (ref 140–400)
RBC: 3.78 10*6/uL — ABNORMAL LOW (ref 3.80–5.10)
RDW: 12.6 % (ref 11.0–15.0)
Total Lymphocyte: 23.1 %
WBC: 6.2 10*3/uL (ref 3.8–10.8)

## 2022-03-24 LAB — BASIC METABOLIC PANEL
BUN/Creatinine Ratio: 11 (calc) (ref 6–22)
BUN: 26 mg/dL — ABNORMAL HIGH (ref 7–25)
CO2: 34 mmol/L — ABNORMAL HIGH (ref 20–32)
Calcium: 9.4 mg/dL (ref 8.6–10.4)
Chloride: 102 mmol/L (ref 98–110)
Creat: 2.4 mg/dL — ABNORMAL HIGH (ref 0.60–0.95)
Glucose, Bld: 93 mg/dL (ref 65–139)
Potassium: 4.8 mmol/L (ref 3.5–5.3)
Sodium: 142 mmol/L (ref 135–146)

## 2022-03-24 LAB — HEMOGLOBIN A1C
Hgb A1c MFr Bld: 6.2 % of total Hgb — ABNORMAL HIGH (ref <5.7)
Mean Plasma Glucose: 131 mg/dL
eAG (mmol/L): 7.3 mmol/L

## 2022-03-26 NOTE — Progress Notes (Signed)
Letter to patient placed in mail. No release of information form or power of attorney in chart.

## 2022-03-27 ENCOUNTER — Telehealth: Payer: Self-pay

## 2022-03-28 LAB — URINALYSIS, ROUTINE W REFLEX MICROSCOPIC
Bilirubin Urine: NEGATIVE
Glucose, UA: NEGATIVE
Hgb urine dipstick: NEGATIVE
Hyaline Cast: NONE SEEN /LPF
Ketones, ur: NEGATIVE
Nitrite: NEGATIVE
Specific Gravity, Urine: 1.011 (ref 1.001–1.035)
pH: 6 (ref 5.0–8.0)

## 2022-03-28 LAB — URINE CULTURE
MICRO NUMBER:: 14189719
SPECIMEN QUALITY:: ADEQUATE

## 2022-03-28 LAB — MICROSCOPIC MESSAGE

## 2022-03-30 ENCOUNTER — Other Ambulatory Visit: Payer: Self-pay | Admitting: Family Medicine

## 2022-03-30 MED ORDER — CEPHALEXIN 500 MG PO CAPS: 500.0000 mg | ORAL_CAPSULE | Freq: Three times a day (TID) | ORAL | 0 refills | Status: DC

## 2022-03-31 ENCOUNTER — Other Ambulatory Visit: Payer: Self-pay

## 2022-03-31 MED ORDER — CEPHALEXIN 500 MG PO CAPS: 500.0000 mg | ORAL_CAPSULE | Freq: Three times a day (TID) | ORAL | 0 refills | Status: AC

## 2022-03-31 NOTE — Telephone Encounter (Signed)
Pt lvm stating medication cost was too expensive. Requesting replacement medication.

## 2022-03-31 NOTE — Telephone Encounter (Signed)
It is generic cephalexin, shouldn't t be any more than $5-10

## 2022-05-08 ENCOUNTER — Telehealth: Payer: Self-pay

## 2022-05-08 NOTE — Telephone Encounter (Signed)
Spoke to family concerning Cassidy Jennings. They were concerned in the increase of forgetfulness. After contacting her Memory Doctor they were advised to rule-out a possible UTI.   Daughter is to bring urine sample for analysis and culture.

## 2022-05-14 ENCOUNTER — Other Ambulatory Visit (INDEPENDENT_AMBULATORY_CARE_PROVIDER_SITE_OTHER): Payer: 59

## 2022-05-14 DIAGNOSIS — R8290 Unspecified abnormal findings in urine: Secondary | ICD-10-CM | POA: Diagnosis not present

## 2022-05-14 DIAGNOSIS — R410 Disorientation, unspecified: Secondary | ICD-10-CM

## 2022-05-14 LAB — POCT URINALYSIS DIP (CLINITEK)
Bilirubin, UA: NEGATIVE
Glucose, UA: NEGATIVE mg/dL
Ketones, POC UA: NEGATIVE mg/dL
Nitrite, UA: NEGATIVE
POC PROTEIN,UA: 100 — AB
Spec Grav, UA: 1.02 (ref 1.010–1.025)
Urobilinogen, UA: 0.2 E.U./dL
pH, UA: 5.5 (ref 5.0–8.0)

## 2022-05-16 LAB — URINE CULTURE
MICRO NUMBER:: 14393528
SPECIMEN QUALITY:: ADEQUATE

## 2022-06-09 DIAGNOSIS — H52202 Unspecified astigmatism, left eye: Secondary | ICD-10-CM | POA: Diagnosis not present

## 2022-06-09 DIAGNOSIS — H35012 Changes in retinal vascular appearance, left eye: Secondary | ICD-10-CM | POA: Diagnosis not present

## 2022-06-09 DIAGNOSIS — F17210 Nicotine dependence, cigarettes, uncomplicated: Secondary | ICD-10-CM | POA: Diagnosis not present

## 2022-06-09 DIAGNOSIS — H524 Presbyopia: Secondary | ICD-10-CM | POA: Diagnosis not present

## 2022-06-09 DIAGNOSIS — I10 Essential (primary) hypertension: Secondary | ICD-10-CM | POA: Diagnosis not present

## 2022-06-09 DIAGNOSIS — H401133 Primary open-angle glaucoma, bilateral, severe stage: Secondary | ICD-10-CM | POA: Diagnosis not present

## 2022-06-09 DIAGNOSIS — H5212 Myopia, left eye: Secondary | ICD-10-CM | POA: Diagnosis not present

## 2022-06-09 DIAGNOSIS — H2513 Age-related nuclear cataract, bilateral: Secondary | ICD-10-CM | POA: Diagnosis not present

## 2022-06-10 DIAGNOSIS — I701 Atherosclerosis of renal artery: Secondary | ICD-10-CM | POA: Diagnosis not present

## 2022-06-10 DIAGNOSIS — N184 Chronic kidney disease, stage 4 (severe): Secondary | ICD-10-CM | POA: Diagnosis not present

## 2022-06-10 DIAGNOSIS — I10 Essential (primary) hypertension: Secondary | ICD-10-CM | POA: Diagnosis not present

## 2022-06-25 DIAGNOSIS — I6523 Occlusion and stenosis of bilateral carotid arteries: Secondary | ICD-10-CM | POA: Diagnosis not present

## 2022-06-25 DIAGNOSIS — F17200 Nicotine dependence, unspecified, uncomplicated: Secondary | ICD-10-CM | POA: Diagnosis not present

## 2022-06-25 DIAGNOSIS — N1831 Chronic kidney disease, stage 3a: Secondary | ICD-10-CM | POA: Diagnosis not present

## 2022-06-25 DIAGNOSIS — I701 Atherosclerosis of renal artery: Secondary | ICD-10-CM | POA: Diagnosis not present

## 2022-06-25 DIAGNOSIS — I10 Essential (primary) hypertension: Secondary | ICD-10-CM | POA: Diagnosis not present

## 2022-06-29 ENCOUNTER — Telehealth: Payer: Self-pay | Admitting: Family Medicine

## 2022-06-29 NOTE — Telephone Encounter (Signed)
Contacted Cassidy Jennings to schedule their annual wellness visit. Appointment made for 07/10/22 at [facility] Direct Dial: [phone]

## 2022-07-10 ENCOUNTER — Ambulatory Visit (INDEPENDENT_AMBULATORY_CARE_PROVIDER_SITE_OTHER): Payer: 59 | Admitting: Family Medicine

## 2022-07-10 DIAGNOSIS — Z0000 Encounter for general adult medical examination without abnormal findings: Secondary | ICD-10-CM

## 2022-07-10 DIAGNOSIS — Z780 Asymptomatic menopausal state: Secondary | ICD-10-CM | POA: Diagnosis not present

## 2022-07-10 NOTE — Patient Instructions (Addendum)
[facility] Maintenance Summary and Written Plan of Care  Cassidy Jennings ,  Thank you for allowing me to perform your Medicare Annual Wellness Visit and for your ongoing commitment to your health.   Health Maintenance & Immunization History Health Maintenance  Topic Date Due   DTaP/Tdap/Td (2 - Td or Tdap) 04/20/2021   COVID-19 Vaccine (1) 07/26/2022 (Originally 05/28/1945)   Zoster Vaccines- Shingrix (1 of 2) 10/10/2022 (Originally 05/28/1959)   Medicare Annual Wellness (AWV)  07/10/2023   Pneumonia Vaccine 32+ Years old  Completed   INFLUENZA VACCINE  Completed   DEXA SCAN  Completed   HPV VACCINES  Aged Out   Immunization History  Administered Date(s) Administered   Fluad Quad(high Dose 65+) 03/23/2022   Influenza Split 03/22/2011, 03/31/2012   Influenza Whole 03/19/2009, 04/17/2010   Influenza, High Dose Seasonal PF 03/30/2019   Influenza,inj,Quad PF,6+ Mos 01/31/2015, 01/13/2017   Influenza-Unspecified 05/12/2015, 03/11/2018   PPD Test 02/26/2020   Pneumococcal Conjugate-13 01/31/2015   Pneumococcal Polysaccharide-23 04/21/2011   Tdap 04/21/2011   Zoster, Live 04/10/2012    These are the patient goals that we discussed:  Goals Addressed               This Visit's Progress     Patient Stated (pt-stated)        07/10/2022 AWV Goal: Tobacco Cessation  Smoking cessation instruction/counseling given:  counseled patient on the dangers of tobacco use, advised patient to stop smoking, and reviewed strategies to maximize success  Patient will verbalize understanding of the health risks associated with smoking/tobacco use Lung cancer or lung disease, such as COPD Heart disease. Stroke. Heart attack Infertility Osteoporosis and bone fractures. Patient will create a plan to quit smoking/using tobacco Pick a date to quit.  Write down the reasons why you are quitting and put it where you will see it often. Identify the people, places, things,  and activities that make you want to smoke (triggers) and avoid them. Make sure to take these actions: Throw away all cigarettes at home, at work, and in your car. Throw away smoking accessories, such as Scientist, research (medical). Clean your car and make sure to empty the ashtray. Clean your home, including curtains and carpets. Tell your family, friends, and coworkers that you are quitting. Support from your loved ones can make quitting easier. Talk with your health care provider about your options for quitting smoking. Find out what treatment options are covered by your health insurance. Patient will be able to demonstrate knowledge of tobacco cessation strategies that may maximize success Quitting "cold [place]" is more successful than gradually quitting. Attending in-person counseling to help you build problem-solving skills.  Finding resources and support systems that can help you to quit smoking and remain smoke-free after you quit. These resources are most helpful when you use them often. They can include: Online chats with a Social worker. Telephone quitlines. Printed Furniture conservator/restorer. Support groups or group counseling. Text messaging programs. Mobile phone applications. Taking medicines to help you quit smoking: Nicotine patches, gum, or lozenges. Nicotine inhalers or sprays. Non-nicotine medicine that is taken by mouth. Patient will note get discouraged if the process is difficult Over the next year, patient will stop smoking or using other forms of tobacco  Smoking cessation instruction/counseling given:  counseled patient on the dangers of tobacco use, advised patient to stop smoking, and reviewed strategies to maximize success           This is a list of  Health Maintenance Items that are overdue or due now: Health Maintenance Due  Topic Date Due   DTaP/Tdap/Td (2 - Td or Tdap) 04/20/2021  Td vaccine Shingles vaccine Bone density scan   Orders/Referrals Placed Today: No  orders of the defined types were placed in this encounter.  (Contact our referral department at [phone] if you have not spoken with someone about your referral appointment within the next 5 days)    Follow-up Plan Follow-up with [name], DO as planned Schedule shingles vaccine and td vaccine. Medicare wellness visit in one year.  Patient will access AVS on my chart.      Health Maintenance, Female Adopting a healthy lifestyle and getting preventive care are important in promoting health and wellness. Ask your health care provider about: The right schedule for you to have regular tests and exams. Things you can do on your own to prevent diseases and keep yourself healthy. What should I know about diet, weight, and exercise? Eat a healthy diet  Eat a diet that includes plenty of vegetables, fruits, low-fat dairy products, and lean protein. Do not eat a lot of foods that are high in solid fats, added sugars, or sodium. Maintain a healthy weight Body mass index (BMI) is used to identify weight problems. It estimates body fat based on height and weight. Your health care provider can help determine your BMI and help you achieve or maintain a healthy weight. Get regular exercise Get regular exercise. This is one of the most important things you can do for your health. Most adults should: Exercise for at least 150 minutes each week. The exercise should increase your heart rate and make you sweat (moderate-intensity exercise). Do strengthening exercises at least twice a week. This is in addition to the moderate-intensity exercise. Spend less time sitting. Even light physical activity can be beneficial. Watch cholesterol and blood lipids Have your blood tested for lipids and cholesterol at 81 years of age, then have this test every 5 years. Have your cholesterol levels checked more often if: Your lipid or cholesterol levels are high. You are older than 81 years of age. You are at  high risk for heart disease. What should I know about cancer screening? Depending on your health history and family history, you may need to have cancer screening at various ages. This may include screening for: Breast cancer. Cervical cancer. Colorectal cancer. Skin cancer. Lung cancer. What should I know about heart disease, diabetes, and high blood pressure? Blood pressure and heart disease High blood pressure causes heart disease and increases the risk of stroke. This is more likely to develop in people who have high blood pressure readings or are overweight. Have your blood pressure checked: Every 3-5 years if you are 61-72 years of age. Every year if you are 34 years old or older. Diabetes Have regular diabetes screenings. This checks your fasting blood sugar level. Have the screening done: Once every three years after age 46 if you are at a normal weight and have a low risk for diabetes. More often and at a younger age if you are overweight or have a high risk for diabetes. What should I know about preventing infection? Hepatitis B If you have a higher risk for hepatitis B, you should be screened for this virus. Talk with your health care provider to find out if you are at risk for hepatitis B infection. Hepatitis C Testing is recommended for: Everyone born from [birth year] through 1964. Anyone with known risk factors  for hepatitis C. Sexually transmitted infections (STIs) Get screened for STIs, including gonorrhea and chlamydia, if: You are sexually active and are younger than 81 years of age. You are older than 81 years of age and your health care provider tells you that you are at risk for this type of infection. Your sexual activity has changed since you were last screened, and you are at increased risk for chlamydia or gonorrhea. Ask your health care provider if you are at risk. Ask your health care provider about whether you are at high risk for HIV. Your health care provider may  recommend a prescription medicine to help prevent HIV infection. If you choose to take medicine to prevent HIV, you should first get tested for HIV. You should then be tested every 3 months for as long as you are taking the medicine. Pregnancy If you are about to stop having your period (premenopausal) and you may become pregnant, seek counseling before you get pregnant. Take 400 to 800 micrograms (mcg) of folic acid every day if you become pregnant. Ask for birth control (contraception) if you want to prevent pregnancy. Osteoporosis and menopause Osteoporosis is a disease in which the bones lose minerals and strength with aging. This can result in bone fractures. If you are 41 years old or older, or if you are at risk for osteoporosis and fractures, ask your health care provider if you should: Be screened for bone loss. Take a calcium or vitamin D supplement to lower your risk of fractures. Be given hormone replacement therapy (HRT) to treat symptoms of menopause. Follow these instructions at home: Alcohol use Do not drink alcohol if: Your health care provider tells you not to drink. You are pregnant, may be pregnant, or are planning to become pregnant. If you drink alcohol: Limit how much you have to: 0-1 drink a day. Know how much alcohol is in your drink. In the [place] one drink equals one 12 oz bottle of beer (355 mL), one 5 oz glass of wine (148 mL), or one 1 oz glass of hard liquor (44 mL). Lifestyle Do not use any products that contain nicotine or tobacco. These products include cigarettes, chewing tobacco, and vaping devices, such as e-cigarettes. If you need help quitting, ask your health care provider. Do not use street drugs. Do not share needles. Ask your health care provider for help if you need support or information about quitting drugs. General instructions Schedule regular health, dental, and eye exams. Stay current with your vaccines. Tell your health care provider  if: You often feel depressed. You have ever been abused or do not feel safe at home. Summary Adopting a healthy lifestyle and getting preventive care are important in promoting health and wellness. Follow your health care provider's instructions about healthy diet, exercising, and getting tested or screened for diseases. Follow your health care provider's instructions on monitoring your cholesterol and blood pressure. This information is not intended to replace advice given to you by your health care provider. Make sure you discuss any questions you have with your health care provider. Document Revised: 09/16/2020 Document Reviewed: 09/16/2020 [employer] Patient Education  [place].

## 2022-07-10 NOTE — Progress Notes (Signed)
MEDICARE ANNUAL WELLNESS VISIT  07/10/2022  Telephone Visit Disclaimer This Medicare AWV was conducted by telephone due to national recommendations for restrictions regarding the COVID-19 Pandemic (e.g. social distancing).  I verified, using two identifiers, that I am speaking with Cassidy Jennings or their authorized healthcare agent. I discussed the limitations, risks, security, and privacy concerns of performing an evaluation and management service by telephone and the potential availability of an in-person appointment in the future. The patient expressed understanding and agreed to proceed.  Location of Patient: Home with daughter in law, Governor Specking Location of Provider (nurse):  In the office.  Subjective:    Cassidy Jennings is a 82 y.o. female patient of [name], DO who had a Medicare Annual Wellness Visit today via telephone. Cassidy Jennings is Retired and lives with their family. she has 6 children. she reports that she is socially active and does interact with friends/family regularly. she is minimally physically active and enjoys puzzle books.  Patient Care Team: [name], DO as PCP - General (Family Medicine) Darius Bump, [facility] as Pharmacist (Pharmacist)     07/10/2022    3:42 PM 10/17/2018   10:57 AM 02/11/2015   11:52 AM  Advanced Directives  Does Patient Have a Medical Advance Directive? No No Yes  Type of Scientist, physiological of [place];Living will  Does patient want to make changes to medical advance directive?   No - Patient declined  Would patient like information on creating a medical advance directive? No - Patient declined No - Patient declined     Hospital Utilization Over the Past 12 Months: # of hospitalizations or ER visits: 0 # of surgeries: 0  Review of Systems    Patient reports that her overall health is better compared to last year.  History obtained from daughter in law, chart review, and the patient  Patient Reported  Readings (BP, Pulse, CBG, Weight, etc) none  Pain Assessment Pain : No/denies pain     Current Medications & Allergies (verified) Allergies as of 07/10/2022       Reactions   Simvastatin Nausea And Vomiting   GERD        Medication List        Accurate as of July 10, 2022  4:02 PM. If you have any questions, ask your nurse or doctor.          amLODipine 10 MG tablet Commonly known as: NORVASC TAKE 1 TABLET(10 MG) BY MOUTH DAILY   aspirin 81 MG tablet Take 81 mg by mouth daily.   donepezil 10 MG tablet Commonly known as: ARICEPT TAKE 1 TABLET(10 MG) BY MOUTH AT BEDTIME AS NEEDED   labetalol 100 MG tablet Commonly known as: NORMODYNE Take 100 mg by mouth 2 (two) times daily.   latanoprost 0.005 % ophthalmic solution Commonly known as: XALATAN Place 1 drop into both eyes at bedtime.   MULTIPLE VITAMIN PO Take by mouth.   risperiDONE 0.25 MG tablet Commonly known as: RISPERDAL Take by mouth.   Vitamin D3 50 MCG (2000 UT) Caps Generic drug: Cholecalciferol Take by mouth.        History (reviewed): Past Medical History:  Diagnosis Date   Chronic kidney disease    Glaucoma    Hyperlipidemia    Hypertension    Prediabetes    Past Surgical History:  Procedure Laterality Date   TUBAL LIGATION     Family History  Problem Relation Age of Onset  Hypertension Mother    Heart attack Mother 22   Heart disease Mother    Hypertension Sister    Hypertension Daughter    Hypertension Son    Social History   Socioeconomic History   Marital status: Widowed    Spouse name: Not on file   Number of children: 6   Years of education: 62   Highest education level: 12th grade  Occupational History   Occupation: cleaning lady    Comment: retired  Tobacco Use   Smoking status: Every Day    Packs/day: 0.25    Years: 63.00    Total pack years: 15.75    Types: Cigarettes   Smokeless tobacco: Never  Vaping Use   Vaping Use: Never used  Substance and  Sexual Activity   Alcohol use: No   Drug use: No   Sexual activity: Not Currently  Other Topics Concern   Not on file  Social History Narrative   She smokes but has multiple family members that smoke. Stays active but no regular exercise. Walks to mail box and back daily is how she gets her exercise.   Social Determinants of Health   Financial Resource Strain: Low Risk  (07/06/2022)   Overall Financial Resource Strain (CARDIA)    Difficulty of Paying Living Expenses: Not hard at all  Food Insecurity: No Food Insecurity (07/06/2022)   Hunger Vital Sign    Worried About Running Out of Food in the Last Year: Never true    Ran Out of Food in the Last Year: Never true  Transportation Needs: No Transportation Needs (07/06/2022)   PRAPARE - Hydrologist (Medical): No    Lack of Transportation (Non-Medical): No  Physical Activity: Insufficiently Active (07/10/2022)   Exercise Vital Sign    Days of Exercise per Week: 7 days    Minutes of Exercise per Session: 10 min  Stress: Stress Concern Present (07/06/2022)   [place]    Feeling of Stress : Very much  Social Connections: Moderately Isolated (07/10/2022)   Social Connection and Isolation Panel [NHANES]    Frequency of Communication with Friends and Family: More than three times a week    Frequency of Social Gatherings with Friends and Family: More than three times a week    Attends Religious Services: 1 to 4 times per year    Active Member of [employer] or Organizations: No    Attends Archivist Meetings: Never    Marital Status: Widowed    Activities of Daily Living    07/06/2022    5:41 PM  In your present state of health, do you have any difficulty performing the following activities:  Hearing? 1  Vision? 1  Difficulty concentrating or making decisions? 1  Walking or climbing stairs? 0  Dressing or bathing? 1  Doing errands,  shopping? 1  Preparing Food and eating ? Y  Using the Toilet? N  In the past six months, have you accidently leaked urine? N  Do you have problems with loss of bowel control? N  Managing your Medications? N  Managing your Finances? Y  Housekeeping or managing your Housekeeping? N    Patient Education/ Literacy How often do you need to have someone help you when you read instructions, pamphlets, or other written materials from your doctor or pharmacy?: 1 - Never What is the last grade level you completed in school?: 12th grade  Exercise Current Exercise Habits:  Home exercise routine, Type of exercise: walking, Time (Minutes): 10, Frequency (Times/Week): 7, Weekly Exercise (Minutes/Week): 70, Intensity: Moderate, Exercise limited by: None identified  Diet Patient reports consuming 2 meals a day and 3 snack(s) a day Patient reports that her primary diet is: Regular Patient reports that she does have regular access to food.   Depression Screen    07/10/2022    3:43 PM 03/23/2022    2:36 PM 12/04/2021    3:58 PM 09/18/2020   11:55 AM 10/17/2018   10:58 AM 09/13/2018    9:09 AM 04/12/2018   10:33 AM  PHQ 2/9 Scores  PHQ - 2 Score 0   3 0 0 0  PHQ- 9 Score    3     Exception Documentation  Medical reason Medical reason         Fall Risk    07/10/2022    3:42 PM 07/06/2022    5:41 PM 12/04/2021    3:58 PM 09/18/2020   11:55 AM 10/17/2018   10:58 AM  Fall Risk   Falls in the past year? 0 0 0 1 0  Number falls in past yr: 0 0 0 0   Injury with Fall? 0 0 0 1   Risk for fall due to : Other (Comment)  No Fall Risks Impaired balance/gait   Follow up Falls evaluation completed  Falls evaluation completed Falls evaluation completed Falls prevention discussed     Objective:  Cassidy Jennings MAKENZEE MATHE seemed alert and oriented and she participated appropriately during our telephone visit.  Blood Pressure Weight BMI  BP Readings from Last 3 Encounters:  03/23/22 106/73  12/19/21 130/79  12/04/21  (!) 179/61   Wt Readings from Last 3 Encounters:  03/23/22 121 lb (54.9 kg)  12/04/21 116 lb (52.6 kg)  11/10/21 116 lb (52.6 kg)   BMI Readings from Last 1 Encounters:  03/23/22 20.77 kg/m    *Unable to obtain current vital signs, weight, and BMI due to telephone visit type  Hearing/Vision  Cassidy Jennings did not seem to have difficulty with hearing/understanding during the telephone conversation Reports that she has had a formal eye exam by an eye care professional within the past year Reports that she has not had a formal hearing evaluation within the past year *Unable to fully assess hearing and vision during telephone visit type  Cognitive Function:    07/10/2022    3:47 PM 10/17/2018   11:00 AM  6CIT Screen  What Year? 4 points 0 points  What month? 0 points 0 points  What time? 0 points 0 points  Count back from 20 2 points 0 points  Months in reverse 4 points 4 points  Repeat phrase 10 points 0 points  Total Score 20 points 4 points   (Normal:0-7, Significant for Dysfunction: >8)  Normal Cognitive Function Screening: No: is currently taking Aricept   Immunization & Health Maintenance Record Immunization History  Administered Date(s) Administered   Fluad Quad(high Dose 65+) 03/23/2022   Influenza Split 03/22/2011, 03/31/2012   Influenza Whole 03/19/2009, 04/17/2010   Influenza, High Dose Seasonal PF 03/30/2019   Influenza,inj,Quad PF,6+ Mos 01/31/2015, 01/13/2017   Influenza-Unspecified 05/12/2015, 03/11/2018   PPD Test 02/26/2020   Pneumococcal Conjugate-13 01/31/2015   Pneumococcal Polysaccharide-23 04/21/2011   Tdap 04/21/2011   Zoster, Live 04/10/2012    Health Maintenance  Topic Date Due   DTaP/Tdap/Td (2 - Td or Tdap) 04/20/2021   COVID-19 Vaccine (1) 07/26/2022 (Originally 05/28/1945)   Zoster Vaccines- Shingrix (1  of 2) 10/10/2022 (Originally 05/28/1959)   Medicare Annual Wellness (AWV)  07/10/2023   Pneumonia Vaccine 88+ Years old  Completed   INFLUENZA  VACCINE  Completed   DEXA SCAN  Completed   HPV VACCINES  Aged Out       Assessment  This is a routine wellness examination for [employer].  Health Maintenance: Due or Overdue Health Maintenance Due  Topic Date Due   DTaP/Tdap/Td (2 - Td or Tdap) 04/20/2021    Cassidy Jennings does not need a referral for Community Assistance: Care Management:   no Social Work:    no Prescription Assistance:  no Nutrition/Diabetes Education:  no   Plan:  Personalized Goals  Goals Addressed               This Visit's Progress     Patient Stated (pt-stated)        07/10/2022 AWV Goal: Tobacco Cessation  Smoking cessation instruction/counseling given:  counseled patient on the dangers of tobacco use, advised patient to stop smoking, and reviewed strategies to maximize success  Patient will verbalize understanding of the health risks associated with smoking/tobacco use Lung cancer or lung disease, such as COPD Heart disease. Stroke. Heart attack Infertility Osteoporosis and bone fractures. Patient will create a plan to quit smoking/using tobacco Pick a date to quit.  Write down the reasons why you are quitting and put it where you will see it often. Identify the people, places, things, and activities that make you want to smoke (triggers) and avoid them. Make sure to take these actions: Throw away all cigarettes at home, at work, and in your car. Throw away smoking accessories, such as Scientist, research (medical). Clean your car and make sure to empty the ashtray. Clean your home, including curtains and carpets. Tell your family, friends, and coworkers that you are quitting. Support from your loved ones can make quitting easier. Talk with your health care provider about your options for quitting smoking. Find out what treatment options are covered by your health insurance. Patient will be able to demonstrate knowledge of tobacco cessation strategies that may maximize  success Quitting "cold [place]" is more successful than gradually quitting. Attending in-person counseling to help you build problem-solving skills.  Finding resources and support systems that can help you to quit smoking and remain smoke-free after you quit. These resources are most helpful when you use them often. They can include: Online chats with a Social worker. Telephone quitlines. Printed Furniture conservator/restorer. Support groups or group counseling. Text messaging programs. Mobile phone applications. Taking medicines to help you quit smoking: Nicotine patches, gum, or lozenges. Nicotine inhalers or sprays. Non-nicotine medicine that is taken by mouth. Patient will note get discouraged if the process is difficult Over the next year, patient will stop smoking or using other forms of tobacco  Smoking cessation instruction/counseling given:  counseled patient on the dangers of tobacco use, advised patient to stop smoking, and reviewed strategies to maximize success         Personalized Health Maintenance & Screening Recommendations  Td vaccine Shingles vaccine Bone density scan  Lung Cancer Screening Recommended: no (Low Dose CT Chest recommended if Age 20-80 years, 30 pack-year currently smoking OR have quit w/in past 15 years) Hepatitis C Screening recommended: no HIV Screening recommended: no  Advanced Directives: Written information was not prepared per patient's request.  Referrals & Orders Orders Placed This Encounter  Procedures   [place]    Follow-up Plan Follow-up with [name],  DO as planned Schedule shingles vaccine and td vaccine. Medicare wellness visit in one year.  Patient will access AVS on my chart.   I have personally reviewed and noted the following in the patient's chart:   Medical and social history Use of alcohol, tobacco or illicit drugs  Current medications and supplements Functional ability and status Nutritional status Physical  activity Advanced directives List of other physicians Hospitalizations, surgeries, and ER visits in previous 12 months Vitals Screenings to include cognitive, depression, and falls Referrals and appointments  In addition, I have reviewed and discussed with Cassidy Jennings certain preventive protocols, quality metrics, and best practice recommendations. A written personalized care plan for preventive services as well as general preventive health recommendations is available and can be mailed to the patient at her request.      Tinnie Gens, RN BSN  07/10/2022

## 2022-07-13 DIAGNOSIS — D649 Anemia, unspecified: Secondary | ICD-10-CM | POA: Diagnosis not present

## 2022-07-13 DIAGNOSIS — K219 Gastro-esophageal reflux disease without esophagitis: Secondary | ICD-10-CM | POA: Diagnosis not present

## 2022-07-13 DIAGNOSIS — I10 Essential (primary) hypertension: Secondary | ICD-10-CM | POA: Diagnosis not present

## 2022-07-13 DIAGNOSIS — K279 Peptic ulcer, site unspecified, unspecified as acute or chronic, without hemorrhage or perforation: Secondary | ICD-10-CM | POA: Diagnosis not present

## 2022-07-13 DIAGNOSIS — D509 Iron deficiency anemia, unspecified: Secondary | ICD-10-CM | POA: Diagnosis not present

## 2022-07-13 DIAGNOSIS — K226 Gastro-esophageal laceration-hemorrhage syndrome: Secondary | ICD-10-CM | POA: Diagnosis not present

## 2022-07-29 ENCOUNTER — Ambulatory Visit (INDEPENDENT_AMBULATORY_CARE_PROVIDER_SITE_OTHER): Payer: 59

## 2022-07-29 DIAGNOSIS — Z780 Asymptomatic menopausal state: Secondary | ICD-10-CM

## 2022-07-29 DIAGNOSIS — Z0000 Encounter for general adult medical examination without abnormal findings: Secondary | ICD-10-CM | POA: Diagnosis not present

## 2022-07-29 DIAGNOSIS — M810 Age-related osteoporosis without current pathological fracture: Secondary | ICD-10-CM | POA: Diagnosis not present

## 2022-08-19 ENCOUNTER — Ambulatory Visit (INDEPENDENT_AMBULATORY_CARE_PROVIDER_SITE_OTHER): Payer: 59 | Admitting: Family Medicine

## 2022-08-19 ENCOUNTER — Encounter: Payer: Self-pay | Admitting: Family Medicine

## 2022-08-19 VITALS — BP 144/70 | HR 72 | Ht 64.0 in | Wt 117.0 lb

## 2022-08-19 DIAGNOSIS — M810 Age-related osteoporosis without current pathological fracture: Secondary | ICD-10-CM | POA: Diagnosis not present

## 2022-08-19 NOTE — Assessment & Plan Note (Signed)
We discussed options for management of her osteoporosis.  She would like to be Prolida.  We discussed that when she starts this she should remain on this due to increased risk of fracture if stopped.  Labs ordered to have completed 1 week prior to the injection.

## 2022-08-19 NOTE — Progress Notes (Signed)
Cassidy Jennings - 81 y.o. female MRN [id number]  Date of birth: March 14, 1941  Subjective Chief Complaint  Patient presents with   Hypertension   Osteoporosis    HPI Cassidy Jennings is a an 82 year old female here today to discuss treatment for osteoporosis.  DEXA scan with T-score -3.3.  She is taking calcium and vitamin D supplementation.  She has never been on any treatment for osteoporosis in the past.  ROS:  A comprehensive ROS was completed and negative except as noted per HPI  Allergies  Allergen Reactions   Simvastatin Nausea And Vomiting    GERD    Past Medical History:  Diagnosis Date   Age-related osteoporosis without current pathological fracture 08/19/2022   Chronic kidney disease    Glaucoma    Hyperlipidemia    Hypertension    Prediabetes     Past Surgical History:  Procedure Laterality Date   TUBAL LIGATION      Social History   Socioeconomic History   Marital status: Widowed    Spouse name: Not on file   Number of children: 6   Years of education: 71   Highest education level: 12th grade  Occupational History   Occupation: cleaning lady    Comment: retired  Tobacco Use   Smoking status: Every Day    Packs/day: 0.25    Years: 63.00    Additional pack years: 0.00    Total pack years: 15.75    Types: Cigarettes   Smokeless tobacco: Never  Vaping Use   Vaping Use: Never used  Substance and Sexual Activity   Alcohol use: No   Drug use: No   Sexual activity: Not Currently  Other Topics Concern   Not on file  Social History Narrative   She smokes but has multiple family members that smoke. Stays active but no regular exercise. Walks to mail box and back daily is how she gets her exercise.   Social Determinants of Health   Financial Resource Strain: Low Risk  (08/18/2022)   Overall Financial Resource Strain (CARDIA)    Difficulty of Paying Living Expenses: Not hard at all  Food Insecurity: No Food Insecurity (08/18/2022)   Hunger Vital Sign     Worried About Running Out of Food in the Last Year: Never true    Ran Out of Food in the Last Year: Never true  Transportation Needs: No Transportation Needs (08/18/2022)   PRAPARE - Administrator, Civil Service (Medical): No    Lack of Transportation (Non-Medical): No  Physical Activity: Inactive (08/18/2022)   Exercise Vital Sign    Days of Exercise per Week: 0 days    Minutes of Exercise per Session: 10 min  Stress: No Stress Concern Present (08/18/2022)   [employer] of Occupational Health - Occupational Stress Questionnaire    Feeling of Stress : Not at all  Recent Concern: Stress - Stress Concern Present (07/06/2022)   [employer] of Occupational Health - Occupational Stress Questionnaire    Feeling of Stress : Very much  Social Connections: Moderately Isolated (08/18/2022)   Social Connection and Isolation Panel [NHANES]    Frequency of Communication with Friends and Family: More than three times a week    Frequency of Social Gatherings with Friends and Family: More than three times a week    Attends Religious Services: 1 to 4 times per year    Active Member of [employer] or Organizations: No    Attends Banker Meetings: Never  Marital Status: Widowed    Family History  Problem Relation Age of Onset   Hypertension Mother    Heart attack Mother 15   Heart disease Mother    Hypertension Sister    Hypertension Daughter    Hypertension Son     Health Maintenance  Topic Date Due   DTaP/Tdap/Td (2 - Td or Tdap) 04/20/2021   COVID-19 Vaccine (1) 10/04/2022 (Originally 05/28/1945)   Zoster Vaccines- Shingrix (1 of 2) 10/10/2022 (Originally 05/28/1959)   INFLUENZA VACCINE  12/10/2022   Medicare Annual Wellness (AWV)  07/29/2023   Pneumonia Vaccine 107+ Years old  Completed   DEXA SCAN  Completed   HPV VACCINES  Aged Out      ----------------------------------------------------------------------------------------------------------------------------------------------------------------------------------------------------------------- Physical Exam BP (!) 144/70 (BP Location: Left Arm, Patient Position: Sitting, Cuff Size: Normal)   Pulse 72   Ht 5\' 4"  (1.626 m)   Wt 117 lb (53.1 kg)   SpO2 97%   BMI 20.08 kg/m   Physical Exam Constitutional:      Appearance: Normal appearance.  HENT:     Head: Normocephalic and atraumatic.  Eyes:     General: No scleral icterus. Neurological:     Mental Status: She is alert.  Psychiatric:        Mood and Affect: Mood normal.        Behavior: Behavior normal.     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  She would like to be Prolida.  We discussed that when she starts this she should remain on this due to increased risk of fracture if stopped.  Labs ordered to have completed 1 week prior to the injection.   No orders of the defined types were placed in this encounter.   No follow-ups on file.    This visit occurred during the SARS-CoV-2 public health emergency.  Safety protocols were in place, including screening questions prior to the visit, additional usage of staff PPE, and extensive cleaning of exam room while observing appropriate contact time as indicated for disinfecting solutions.

## 2022-08-19 NOTE — Patient Instructions (Signed)
Osteoporosis  Osteoporosis is when the bones get thin and weak. This can cause your bones to break (fracture) more easily. What are the causes? The exact cause of this condition is not known. What increases the risk? Having family members with this condition. Not eating enough healthy foods. Taking certain medicines. Being female. Being age 81 or older. Smoking or using other products that contain nicotine or tobacco, such as e-cigarettes or chewing tobacco. Not exercising. Being of European or Asian ancestry. Having a small body frame. What are the signs or symptoms? A broken bone might be the first sign, especially if the break results from a fall or injury that usually would not cause a bone to break. Other signs and symptoms include: Pain in the neck or low back. Being hunched over (stooped posture). Getting shorter. How is this treated? Eating more foods with more calcium and vitamin D in them. Doing exercises. Stopping tobacco use. Limiting how much alcohol you drink. Taking medicines to slow bone loss or help make the bones stronger. Taking supplements of calcium and vitamin D every day. Taking medicines to replace chemicals in the body (hormone replacement medicines). Monitoring your levels of calcium and vitamin D. The goal of treatment is to strengthen your bones and lower your risk for a bone break. Follow these instructions at home: Eating and drinking Eat plenty of calcium and vitamin D. These nutrients are good for your bones. Good sources of calcium and vitamin D include: Some fish, such as salmon and tuna. Foods that have calcium and vitamin D added to them (fortified foods), such as some breakfast cereals. Egg yolks. Cheese. Liver.  Activity Do exercises as told by your doctor. Ask your doctor what exercises are safe for you. You should do: Exercises that make your muscles work to hold your body weight up (weight-bearing exercises). These include tai chi,  yoga, and walking. Exercises to make your muscles stronger. One example is lifting weights. Lifestyle Do not drink alcohol if: Your doctor tells you not to drink. You are pregnant, may be pregnant, or are planning to become pregnant. If you drink alcohol: Limit how much you use to: 0-1 drink a day for women. 0-2 drinks a day for men. Know how much alcohol is in your drink. In the [place] one drink equals one 12 oz bottle of beer (355 mL), one 5 oz glass of wine (148 mL), or one 1 oz glass of hard liquor (44 mL). Do not smoke or use any products that contain nicotine or tobacco. If you need help quitting, ask your doctor. Preventing falls Use tools to help you move around (mobility aids) as needed. These include canes, walkers, scooters, and crutches. Keep rooms well-lit. Put away things on the floor that could make you trip. These include cords and rugs. Install safety rails on stairs. Install grab bars in bathrooms. Use rubber mats in slippery areas, like bathrooms. Wear shoes that: Fit you well. Support your feet. Have closed toes. Have rubber soles or low heels. Tell your doctor about all of the medicines you are taking. Some medicines can make you more likely to fall. General instructions Take over-the-counter and prescription medicines only as told by your doctor. Keep all follow-up visits. Contact a doctor if: You have not been tested (screened) for osteoporosis and you are: A woman who is age 65 or older. A man who is age 70 or older. Get help right away if: You fall. You get hurt. Summary Osteoporosis happens when your   bones get thin and weak. Weak bones can break (fracture) more easily. Eat plenty of calcium and vitamin D. These are good for your bones. Tell your doctor about all of the medicines that you take. This information is not intended to replace advice given to you by your health care provider. Make sure you discuss any questions you have with your health care  provider. Document Revised: 10/12/2019 Document Reviewed: 10/12/2019 Elsevier Patient Education  2023 [employer]  

## 2022-08-20 ENCOUNTER — Telehealth: Payer: Self-pay

## 2022-08-20 NOTE — Telephone Encounter (Signed)
Initiated Prior authorization WUX:LKGMWN 60MG /ML syringes Via: Covermymeds Case/Key:[id number] Status: n/a as of 08/20/22 Reason:This medication or product is on your plan's list of covered drugs. Prior authorization is not required at this time. If your pharmacy has questions regarding the processing of your prescription, please have them call the [facility] pharmacy help desk at [phone]. **Please note: This request was submitted electronically. Formulary lowering, tiering exception, cost reduction and/or pre-benefit determination review (including prospective Medicare hospice reviews) requests cannot be requested using this method of submission. Providers contact us at [phone] for further assistance. Notified Pt via: Mychart   Cost Breakdown for this medication is 20% of pt insurance for medicare part D which is $291  Called pt to notify of the  co-pay amount pt is agreeable to  the cost of the injection, pt states she" if she gets a bill in the mail she will cross that bridge when she gets there", she really would like the injection.

## 2022-08-20 NOTE — Telephone Encounter (Signed)
Please contact patient to schedule prolia injection.  Labs are entered to be drawn 1 week prior to injection.

## 2022-08-25 DIAGNOSIS — M810 Age-related osteoporosis without current pathological fracture: Secondary | ICD-10-CM | POA: Diagnosis not present

## 2022-08-25 DIAGNOSIS — Z79899 Other long term (current) drug therapy: Secondary | ICD-10-CM | POA: Diagnosis not present

## 2022-08-26 LAB — BASIC METABOLIC PANEL
BUN/Creatinine Ratio: 14 (calc) (ref 6–22)
BUN: 39 mg/dL — ABNORMAL HIGH (ref 7–25)
CO2: 31 mmol/L (ref 20–32)
Calcium: 8.9 mg/dL (ref 8.6–10.4)
Chloride: 105 mmol/L (ref 98–110)
Creat: 2.86 mg/dL — ABNORMAL HIGH (ref 0.60–0.95)
Glucose, Bld: 115 mg/dL — ABNORMAL HIGH (ref 65–99)
Potassium: 4.3 mmol/L (ref 3.5–5.3)
Sodium: 142 mmol/L (ref 135–146)

## 2022-08-26 LAB — MAGNESIUM: Magnesium: 1.8 mg/dL (ref 1.5–2.5)

## 2022-09-03 ENCOUNTER — Ambulatory Visit (INDEPENDENT_AMBULATORY_CARE_PROVIDER_SITE_OTHER): Payer: 59 | Admitting: Family Medicine

## 2022-09-03 VITALS — BP 148/52 | HR 68 | Ht 64.0 in

## 2022-09-03 DIAGNOSIS — M810 Age-related osteoporosis without current pathological fracture: Secondary | ICD-10-CM | POA: Diagnosis not present

## 2022-09-03 MED ORDER — DENOSUMAB 60 MG/ML ~~LOC~~ SOSY
60.0000 mg | PREFILLED_SYRINGE | Freq: Once | SUBCUTANEOUS | Status: AC
Start: 2022-09-03 — End: 2022-09-03
  Administered 2022-09-03: 60 mg via SUBCUTANEOUS

## 2022-09-03 NOTE — Patient Instructions (Addendum)
Return in 6 months and one day for next Prolia injection. Also come in at least one week before appointment for lab work.

## 2022-09-03 NOTE — Progress Notes (Signed)
Medical screening examination/treatment was performed by qualified clinical staff member and as supervising physician I was immediately available for consultation/collaboration. I have reviewed documentation and agree with assessment and plan.  Cassidy Grandstaff, DO  

## 2022-09-03 NOTE — Progress Notes (Signed)
   Established Patient Office Visit  Subjective   Patient ID: Cassidy Jennings, female    DOB: 06-May-1941  Age: 82 y.o. MRN: [id number]  Chief Complaint  Patient presents with   Osteoporosis    Prolia injection - nurse visit.     HPI Osteoporosis . Prolia injection nurse visit. Patient taking a daily Vit D supplement and getting calcium through multivitamin. This approved by Dr. Ashley Royalty. Patient calcium level normal -kidney function abnormal but stable. Approved for injection by DR. Ashley Royalty.   ROS    Objective:     BP (!) 148/52   Pulse 68   Ht  (1.626 m)   SpO2 97%   BMI 20.08 kg/m    Physical Exam   No results found for any visits on 09/03/22.    The ASCVD Risk score (Arnett DK, et al., 2019) failed to calculate for the following reasons:   The 2019 ASCVD risk score is only valid for ages 63 to 24    Assessment & Plan:  Prolia injection- nurse visit. Admin Prolia  SQ left upper arm . Patient tolerated injection well without complications. Patient will Return in 6 months and one day, also informed to return at least one week before this appointment for lab work.  Problem List Items Addressed This Visit   None   No follow-ups on file.    Elizabeth Palau, LPN

## 2022-09-08 DIAGNOSIS — I701 Atherosclerosis of renal artery: Secondary | ICD-10-CM | POA: Diagnosis not present

## 2022-09-08 DIAGNOSIS — I10 Essential (primary) hypertension: Secondary | ICD-10-CM | POA: Diagnosis not present

## 2022-09-08 DIAGNOSIS — N184 Chronic kidney disease, stage 4 (severe): Secondary | ICD-10-CM | POA: Diagnosis not present

## 2022-09-21 ENCOUNTER — Ambulatory Visit (INDEPENDENT_AMBULATORY_CARE_PROVIDER_SITE_OTHER): Payer: 59 | Admitting: Family Medicine

## 2022-09-21 ENCOUNTER — Encounter: Payer: Self-pay | Admitting: Family Medicine

## 2022-09-21 VITALS — BP 176/73 | HR 66 | Ht 64.0 in | Wt 117.0 lb

## 2022-09-21 DIAGNOSIS — I10 Essential (primary) hypertension: Secondary | ICD-10-CM

## 2022-09-21 MED ORDER — HYDRALAZINE HCL 10 MG PO TABS
10.0000 mg | ORAL_TABLET | Freq: Three times a day (TID) | ORAL | 2 refills | Status: DC
Start: 2022-09-21 — End: 2022-12-18

## 2022-09-21 NOTE — Patient Instructions (Signed)
Continue current medications.  Start hydralazine.  Nurse visit in 2-3 weeks for BP check.

## 2022-09-21 NOTE — Assessment & Plan Note (Signed)
Blood pressure remains elevated.  Adding hydralazine 10 mg 3 times daily.  Continue amlodipine and labetalol.  Encouraged smoking cessation.

## 2022-09-21 NOTE — Progress Notes (Signed)
Cassidy Jennings - 82 y.o. female MRN [id number]  Date of birth: [birth year]  Subjective Chief Complaint  Patient presents with   Hypertension    HPI Sanisha is an 82 year old female here today for follow-up of hypertension.  Blood pressure remains elevated.  Similar readings at home.  Has not had any symptoms related to hypertension.  She does continue to smoke.  She is taking medications as directed.  Denies chest pain, shortness of breath, palpitations, headaches or vision changes.  ROS:  A comprehensive ROS was completed and negative except as noted per HPI  Allergies  Allergen Reactions   Simvastatin Nausea And Vomiting    GERD    Past Medical History:  Diagnosis Date   Age-related osteoporosis without current pathological fracture 08/19/2022   Chronic kidney disease    Glaucoma    Hyperlipidemia    Hypertension    Prediabetes     Past Surgical History:  Procedure Laterality Date   TUBAL LIGATION      Social History   Socioeconomic History   Marital status: Widowed    Spouse name: Not on file   Number of children: 6   Years of education: 47   Highest education level: 12th grade  Occupational History   Occupation: cleaning lady    Comment: retired  Tobacco Use   Smoking status: Every Day    Packs/day: 0.25    Years: 63.00    Additional pack years: 0.00    Total pack years: 15.75    Types: Cigarettes   Smokeless tobacco: Never  Vaping Use   Vaping Use: Never used  Substance and Sexual Activity   Alcohol use: No   Drug use: No   Sexual activity: Not Currently  Other Topics Concern   Not on file  Social History Narrative   She smokes but has multiple family members that smoke. Stays active but no regular exercise. Walks to mail box and back daily is how she gets her exercise.   Social Determinants of Health   Financial Resource Strain: Low Risk  (08/18/2022)   Overall Financial Resource Strain (CARDIA)    Difficulty of Paying Living Expenses: Not hard  at all  Food Insecurity: No Food Insecurity (08/18/2022)   Hunger Vital Sign    Worried About Running Out of Food in the Last Year: Never true    Ran Out of Food in the Last Year: Never true  Transportation Needs: No Transportation Needs (08/18/2022)   PRAPARE - Administrator, Civil Service (Medical): No    Lack of Transportation (Non-Medical): No  Physical Activity: Inactive (08/18/2022)   Exercise Vital Sign    Days of Exercise per Week: 0 days    Minutes of Exercise per Session: 10 min  Stress: No Stress Concern Present (08/18/2022)   [employer] of Occupational Health - Occupational Stress Questionnaire    Feeling of Stress : Not at all  Recent Concern: Stress - Stress Concern Present (07/06/2022)   [employer] of Occupational Health - Occupational Stress Questionnaire    Feeling of Stress : Very much  Social Connections: Moderately Isolated (08/18/2022)   Social Connection and Isolation Panel [NHANES]    Frequency of Communication with Friends and Family: More than three times a week    Frequency of Social Gatherings with Friends and Family: More than three times a week    Attends Religious Services: 1 to 4 times per year    Active Member of Clubs or Organizations: No  Attends Banker Meetings: Never    Marital Status: Widowed    Family History  Problem Relation Age of Onset   Hypertension Mother    Heart attack Mother 82   Heart disease Mother    Hypertension Sister    Hypertension Daughter    Hypertension Son     Health Maintenance  Topic Date Due   DTaP/Tdap/Td (2 - Td or Tdap) 04/20/2021   COVID-19 Vaccine (1) 10/04/2022 (Originally 05/28/1945)   Zoster Vaccines- Shingrix (2 of 2) 10/13/2022   INFLUENZA VACCINE  12/10/2022   Medicare Annual Wellness (AWV)  07/29/2023   Pneumonia Vaccine 41+ Years old  Completed   DEXA SCAN  Completed   HPV VACCINES  Aged Out      ----------------------------------------------------------------------------------------------------------------------------------------------------------------------------------------------------------------- Physical Exam BP (!) 176/73 (BP Location: Left Arm, Patient Position: Sitting, Cuff Size: Normal)   Pulse 66   Ht 5\' 4"  (1.626 m)   Wt 117 lb (53.1 kg)   SpO2 97%   BMI 20.08 kg/m   Physical Exam Constitutional:      Appearance: Normal appearance.  HENT:     Head: Normocephalic and atraumatic.  Eyes:     General: No scleral icterus. Cardiovascular:     Rate and Rhythm: Normal rate and regular rhythm.  Pulmonary:     Effort: Pulmonary effort is normal.     Breath sounds: Normal breath sounds.  Neurological:     Mental Status: She is alert.  Psychiatric:        Mood and Affect: Mood normal.     ------------------------------------------------------------------------------------------------------------------------------------------------------------------------------------------------------------------- Assessment and Plan  Hypertension Blood pressure remains elevated.  Adding hydralazine 10 mg 3 times daily.  Continue amlodipine and labetalol.  Encouraged smoking cessation.   Meds ordered this encounter  Medications   hydrALAZINE (APRESOLINE) 10 MG tablet    Sig: Take 1 tablet (10 mg total) by mouth 3 (three) times daily.    Dispense:  90 tablet    Refill:  2    Return in about 3 weeks (around 10/12/2022) for nurse visit-BP check.    This visit occurred during the SARS-CoV-2 public health emergency.  Safety protocols were in place, including screening questions prior to the visit, additional usage of staff PPE, and extensive cleaning of exam room while observing appropriate contact time as indicated for disinfecting solutions.

## 2022-10-06 DIAGNOSIS — I6523 Occlusion and stenosis of bilateral carotid arteries: Secondary | ICD-10-CM | POA: Diagnosis not present

## 2022-10-06 DIAGNOSIS — I701 Atherosclerosis of renal artery: Secondary | ICD-10-CM | POA: Diagnosis not present

## 2022-10-06 DIAGNOSIS — I739 Peripheral vascular disease, unspecified: Secondary | ICD-10-CM | POA: Diagnosis not present

## 2022-10-12 ENCOUNTER — Ambulatory Visit (INDEPENDENT_AMBULATORY_CARE_PROVIDER_SITE_OTHER): Payer: 59 | Admitting: Family Medicine

## 2022-10-12 VITALS — BP 128/56 | HR 78

## 2022-10-12 DIAGNOSIS — Z720 Tobacco use: Secondary | ICD-10-CM | POA: Diagnosis not present

## 2022-10-12 DIAGNOSIS — I701 Atherosclerosis of renal artery: Secondary | ICD-10-CM | POA: Diagnosis not present

## 2022-10-12 DIAGNOSIS — I10 Essential (primary) hypertension: Secondary | ICD-10-CM

## 2022-10-12 DIAGNOSIS — I739 Peripheral vascular disease, unspecified: Secondary | ICD-10-CM | POA: Diagnosis not present

## 2022-10-12 DIAGNOSIS — I6523 Occlusion and stenosis of bilateral carotid arteries: Secondary | ICD-10-CM | POA: Diagnosis not present

## 2022-10-12 NOTE — Progress Notes (Signed)
   Established Patient Office Visit  Subjective   Patient ID: Cassidy Jennings, female    DOB: [birth year]  Age: 82 y.o. MRN: [id number]  Chief Complaint  Patient presents with   Hypertension    HPI  BRYER HIROSE is here for blood pressure check. Denies chest pain, shortness of breath or dizziness. Her daughter states some home readings have been high 160-180/80-90. I did advised to have Jasma sit for 20 minutes before checking her blood pressure. She also states due to Tashika's sleep schedule she cannot always give her the hydralazine three times daily.   ROS    Objective:     BP (!) 128/56   Pulse 78   SpO2 98%    Physical Exam   No results found for any visits on 10/12/22.    The ASCVD Risk score (Arnett DK, et al., 2019) failed to calculate for the following reasons:   The 2019 ASCVD risk score is only valid for ages 43 to 50    Assessment & Plan:  Hypertension- Blood pressure within normal limits. Patient advised to continue medications as directed. Follow up in 3 months with Dr Ashley Royalty for HTN.   Problem List Items Addressed This Visit       Unprioritized   Hypertension - Primary (Chronic)    Return in about 3 months (around 01/12/2023) for HTN with Dr Ashley Royalty. Earna Coder, Janalyn Harder, CMA

## 2022-10-12 NOTE — Progress Notes (Signed)
Medical screening examination/treatment was performed by qualified clinical staff member and as supervising physician I was immediately available for consultation/collaboration. I have reviewed documentation and agree with assessment and plan.  Kyriaki Moder, DO  

## 2022-10-18 DIAGNOSIS — N179 Acute kidney failure, unspecified: Secondary | ICD-10-CM | POA: Diagnosis not present

## 2022-10-18 DIAGNOSIS — E860 Dehydration: Secondary | ICD-10-CM | POA: Diagnosis not present

## 2022-10-18 DIAGNOSIS — F17210 Nicotine dependence, cigarettes, uncomplicated: Secondary | ICD-10-CM | POA: Diagnosis not present

## 2022-11-09 DIAGNOSIS — I10 Essential (primary) hypertension: Secondary | ICD-10-CM | POA: Diagnosis not present

## 2022-11-09 DIAGNOSIS — I701 Atherosclerosis of renal artery: Secondary | ICD-10-CM | POA: Diagnosis not present

## 2022-11-09 DIAGNOSIS — N184 Chronic kidney disease, stage 4 (severe): Secondary | ICD-10-CM | POA: Diagnosis not present

## 2022-11-30 DIAGNOSIS — N185 Chronic kidney disease, stage 5: Secondary | ICD-10-CM | POA: Diagnosis not present

## 2022-11-30 DIAGNOSIS — I10 Essential (primary) hypertension: Secondary | ICD-10-CM | POA: Diagnosis not present

## 2022-12-10 DIAGNOSIS — I714 Abdominal aortic aneurysm, without rupture, unspecified: Secondary | ICD-10-CM | POA: Diagnosis not present

## 2022-12-10 DIAGNOSIS — I700 Atherosclerosis of aorta: Secondary | ICD-10-CM | POA: Diagnosis not present

## 2022-12-10 DIAGNOSIS — N185 Chronic kidney disease, stage 5: Secondary | ICD-10-CM | POA: Diagnosis not present

## 2022-12-18 ENCOUNTER — Other Ambulatory Visit: Payer: Self-pay | Admitting: Family Medicine

## 2023-01-12 DIAGNOSIS — N185 Chronic kidney disease, stage 5: Secondary | ICD-10-CM | POA: Diagnosis not present

## 2023-01-12 DIAGNOSIS — I10 Essential (primary) hypertension: Secondary | ICD-10-CM | POA: Diagnosis not present

## 2023-03-05 ENCOUNTER — Ambulatory Visit: Payer: 59

## 2023-03-18 ENCOUNTER — Other Ambulatory Visit: Payer: Self-pay | Admitting: Family Medicine

## 2023-04-17 DIAGNOSIS — R279 Unspecified lack of coordination: Secondary | ICD-10-CM | POA: Diagnosis not present

## 2023-04-17 DIAGNOSIS — R000 Tachycardia, unspecified: Secondary | ICD-10-CM | POA: Diagnosis not present

## 2023-04-17 DIAGNOSIS — S0001XA Abrasion of scalp, initial encounter: Secondary | ICD-10-CM | POA: Diagnosis not present

## 2023-04-17 DIAGNOSIS — S0003XA Contusion of scalp, initial encounter: Secondary | ICD-10-CM | POA: Diagnosis not present

## 2023-04-17 DIAGNOSIS — S098XXA Other specified injuries of head, initial encounter: Secondary | ICD-10-CM | POA: Diagnosis not present

## 2023-05-12 DEATH — deceased
